# Patient Record
Sex: Female | Born: 1955 | Race: White | Hispanic: No
Health system: Midwestern US, Community
[De-identification: ages and names within clinical notes are randomized; demographics above are authoritative.]

## PROBLEM LIST (undated history)

## (undated) DIAGNOSIS — M2550 Pain in unspecified joint: Secondary | ICD-10-CM

## (undated) DIAGNOSIS — G4733 Obstructive sleep apnea (adult) (pediatric): Secondary | ICD-10-CM

## (undated) DIAGNOSIS — Z1231 Encounter for screening mammogram for malignant neoplasm of breast: Secondary | ICD-10-CM

## (undated) DIAGNOSIS — R509 Fever, unspecified: Secondary | ICD-10-CM

## (undated) DIAGNOSIS — E119 Type 2 diabetes mellitus without complications: Secondary | ICD-10-CM

## (undated) DIAGNOSIS — R1110 Vomiting, unspecified: Secondary | ICD-10-CM

## (undated) DIAGNOSIS — Z1211 Encounter for screening for malignant neoplasm of colon: Secondary | ICD-10-CM

## (undated) DIAGNOSIS — I499 Cardiac arrhythmia, unspecified: Secondary | ICD-10-CM

## (undated) DIAGNOSIS — G894 Chronic pain syndrome: Secondary | ICD-10-CM

## (undated) DIAGNOSIS — R51 Headache: Secondary | ICD-10-CM

## (undated) DIAGNOSIS — T7840XA Allergy, unspecified, initial encounter: Secondary | ICD-10-CM

## (undated) DIAGNOSIS — F17200 Nicotine dependence, unspecified, uncomplicated: Secondary | ICD-10-CM

## (undated) DIAGNOSIS — F329 Major depressive disorder, single episode, unspecified: Secondary | ICD-10-CM

## (undated) DIAGNOSIS — N840 Polyp of corpus uteri: Secondary | ICD-10-CM

## (undated) DIAGNOSIS — R0609 Other forms of dyspnea: Secondary | ICD-10-CM

## (undated) DIAGNOSIS — K219 Gastro-esophageal reflux disease without esophagitis: Secondary | ICD-10-CM

## (undated) DIAGNOSIS — I10 Essential (primary) hypertension: Secondary | ICD-10-CM

## (undated) DIAGNOSIS — D72829 Elevated white blood cell count, unspecified: Secondary | ICD-10-CM

## (undated) DIAGNOSIS — E042 Nontoxic multinodular goiter: Secondary | ICD-10-CM

## (undated) DIAGNOSIS — G4730 Sleep apnea, unspecified: Secondary | ICD-10-CM

## (undated) DIAGNOSIS — M1990 Unspecified osteoarthritis, unspecified site: Secondary | ICD-10-CM

## (undated) DIAGNOSIS — E785 Hyperlipidemia, unspecified: Secondary | ICD-10-CM

## (undated) DIAGNOSIS — R0989 Other specified symptoms and signs involving the circulatory and respiratory systems: Secondary | ICD-10-CM

## (undated) DIAGNOSIS — E0590 Thyrotoxicosis, unspecified without thyrotoxic crisis or storm: Secondary | ICD-10-CM

## (undated) HISTORY — DX: Other specified symptoms and signs involving the circulatory and respiratory systems: R09.89

## (undated) HISTORY — DX: Sleep apnea, unspecified: G47.30

## (undated) HISTORY — DX: Chronic pain syndrome: G89.4

## (undated) HISTORY — DX: Polyp of corpus uteri: N84.0

## (undated) HISTORY — DX: Unspecified osteoarthritis, unspecified site: M19.90

## (undated) HISTORY — DX: Allergy, unspecified, initial encounter: T78.40XA

## (undated) HISTORY — DX: Type 2 diabetes mellitus without complications: E11.9

## (undated) HISTORY — DX: Hyperlipidemia, unspecified: E78.5

## (undated) HISTORY — DX: Nontoxic multinodular goiter: E04.2

## (undated) HISTORY — DX: Other forms of dyspnea: R06.09

## (undated) HISTORY — DX: Major depressive disorder, single episode, unspecified: F32.9

## (undated) HISTORY — DX: Headache: R51

## (undated) HISTORY — DX: Elevated white blood cell count, unspecified: D72.829

## (undated) HISTORY — DX: Nicotine dependence, unspecified, uncomplicated: F17.200

## (undated) HISTORY — DX: Thyrotoxicosis, unspecified without thyrotoxic crisis or storm: E05.90

## (undated) HISTORY — DX: Gastro-esophageal reflux disease without esophagitis: K21.9

## (undated) HISTORY — DX: Essential (primary) hypertension: I10

## (undated) HISTORY — DX: Cardiac arrhythmia, unspecified: I49.9

## (undated) HISTORY — DX: Morbid (severe) obesity due to excess calories: E66.01

---

## 1990-11-21 HISTORY — PX: CHOLECYSTECTOMY: SHX55

## 2003-11-22 HISTORY — PX: OTHER SURGICAL HISTORY: SHX169

## 2008-11-26 MED ORDER — LISINOPRIL 20 MG TAB
20 mg | ORAL_TABLET | Freq: Every day | ORAL | Status: DC
Start: 2008-11-26 — End: 2009-04-22

## 2008-11-26 NOTE — Progress Notes (Signed)
HISTORY OF PRESENT ILLNESS  Jocelyn Allen is a 53 y.o. female.  HPICheryl is seen today after a long absence for follow up of hypertension, diabetes and other concerns.  Overall, Jocelyn Allen is doing fair.      1. Hypertension.  Readings are elevated.  Jocelyn Allen has been off medication for several months now.  Jocelyn Allen never returned for follow up as previously directed.    2. Diabetes.  Jocelyn Allen has not follow up on this as well.  Jocelyn Allen says her blood sugars are averaging about 130 in the mornings.  3. Fibromyalgia.  Jocelyn Allen is on medications from Dr. Mindi Junker.  4. Neuropathy.  Jocelyn Allen has ongoing symptoms.  Diagnosis have never been formally confirmed but Jocelyn Allen does have medications from Dr. Mindi Junker.                  Review of Systems   Respiratory: Negative for shortness of breath.    Cardiovascular: Positive for palpitations. Negative for chest pain.   Musculoskeletal: Positive for myalgias.   Neurological: Positive for tingling and sensory change.       Physical Exam   Nursing note and vitals reviewed.  Constitutional: Jocelyn Allen appears well-nourished.   Neck: Carotid bruit is not present.   Cardiovascular: Normal rate and regular rhythm.  Exam reveals no gallop and no friction rub.    No murmur heard.  Pulmonary/Chest: Effort normal and breath sounds normal. No respiratory distress.   Musculoskeletal: Jocelyn Allen exhibits no edema.       ASSESSMENT and PLAN  Jocelyn Allen was seen today for follow-up.    Diagnoses and associated orders for this visit:    Unspecified myalgia and myositis  - CBC W/ AUTOMATED DIFF; Future    Essential hypertension, benign  - lisinopril (PRINIVIL, ZESTRIL) tablet; Take 1 Tab by mouth daily.    Dm w/o complication type ii  - BASIC METABOLIC PANEL; Future  - HEMOGLOBIN A1C; Future    Encounter for long-term (current) use of other medications  - CBC W/ AUTOMATED DIFF; Future  - HEPATIC FUNCTION PANEL; Future    Other Orders  - DULOXETINE 30 MG CAP, DELAYED RELEASE; Take 30 mg by mouth daily. 1-2 poqhs prn    - LEVORPHANOL TARTRATE 2 MG TAB; Take 2 mg by mouth as directed. 3 qam, 2 12n, 3qpm   - IBUPROFEN 200 MG TAB; Take  by mouth every six (6) hours as needed.  - NAPROXEN SODIUM 220 MG TAB; Take 220 mg by mouth two (2) times daily (with meals).

## 2008-11-27 LAB — HEPATIC FUNCTION PANEL
A-G Ratio: 1 — ABNORMAL LOW (ref 1.1–2.2)
ALT (SGPT): 38 U/L (ref 30–65)
AST (SGOT): 25 U/L (ref 15–37)
Albumin: 3.6 g/dL (ref 3.5–5.0)
Alk. phosphatase: 126 U/L (ref 50–136)
Bilirubin, direct: 0.1 MG/DL (ref 0.0–0.3)
Bilirubin, total: 0.3 MG/DL (ref <1.0)
Globulin: 3.5 g/dL (ref 2.0–4.0)
Protein, total: 7.1 g/dL (ref 6.4–8.4)

## 2008-11-27 LAB — CBC WITH AUTOMATED DIFF
ABS. BASOPHILS: 0 10*3/uL (ref 0.0–0.1)
ABS. EOSINOPHILS: 0.2 10*3/uL (ref 0.0–0.4)
ABS. LYMPHOCYTES: 3.3 10*3/uL (ref 0.8–3.5)
ABS. MONOCYTES: 0.6 10*3/uL (ref 0.0–1.0)
ABS. NEUTROPHILS: 6.9 10*3/uL (ref 1.8–8.0)
BASOPHILS: 0 % (ref 0–1)
EOSINOPHILS: 2 % (ref 0–7)
HCT: 44.5 % (ref 35.0–47.0)
HGB: 14.8 g/dL (ref 11.5–16.0)
LYMPHOCYTES: 30 % (ref 12–49)
MCH: 27.8 PG (ref 26.0–34.0)
MCHC: 33.3 g/dL (ref 30.0–36.5)
MCV: 83.5 FL (ref 80.0–99.0)
MONOCYTES: 6 % (ref 5–13)
NEUTROPHILS: 62 % (ref 32–75)
PLATELET: 308 10*3/uL (ref 150–400)
RBC: 5.33 M/uL — ABNORMAL HIGH (ref 3.80–5.20)
RDW: 13.2 % (ref 11.5–14.5)
WBC: 11 10*3/uL (ref 3.6–11.0)

## 2008-11-27 LAB — METABOLIC PANEL, BASIC
Anion gap: 10 mmol/L (ref 5–15)
BUN/Creatinine ratio: 15 (ref 12–20)
BUN: 12 MG/DL (ref 6–20)
CO2: 30 MMOL/L (ref 21–32)
Calcium: 9 MG/DL (ref 8.5–10.1)
Chloride: 103 MMOL/L (ref 97–108)
Creatinine: 0.8 MG/DL (ref 0.6–1.3)
GFR est AA: >60 mL/min/{1.73_m2} (ref >60)
GFR est non-AA: >60 mL/min/{1.73_m2} (ref >60)
Glucose: 125 MG/DL — ABNORMAL HIGH (ref 50–100)
Potassium: 4 MMOL/L (ref 3.5–5.1)
Sodium: 143 MMOL/L (ref 136–145)

## 2008-11-27 LAB — HEMOGLOBIN A1C WITH EAG: Hemoglobin A1c: 7.7 % — ABNORMAL HIGH (ref 4.2–5.8)

## 2008-11-27 NOTE — Progress Notes (Signed)
Quick Note:    Will call pt. To review    ______

## 2008-12-06 ENCOUNTER — Telehealth

## 2008-12-06 MED ORDER — METFORMIN 500 MG TAB
500 mg | ORAL_TABLET | Freq: Every day | ORAL | Status: DC
Start: 2008-12-06 — End: 2009-11-30

## 2008-12-06 NOTE — Telephone Encounter (Signed)
Reviewed lab  Dm needs rx , start metformin. rto as dir

## 2009-04-23 MED ORDER — LISINOPRIL 20 MG TAB
20 mg | ORAL_TABLET | ORAL | Status: DC
Start: 2009-04-23 — End: 2009-06-24

## 2009-06-15 ENCOUNTER — Encounter: Payer: Self-pay | Admitting: Internal Medicine

## 2009-06-15 LAB — CONVERTED CEMR LAB
Eosinophils Relative: 2 %
HCT: 45.6 %
Hgb A1c MFr Bld: 5.5 %
MCV: 81 fL
Neutrophils Relative %: 59 %
Platelets: 395 10*3/uL
TSH: 0.006 microintl units/mL

## 2009-06-19 NOTE — Telephone Encounter (Signed)
Unable to reach Pt , no voicemail and cell phone is disconnected, received abnormal labs from Dr Mindi Junker today and his office stated Pt. Would be calling for an appt. Pt. Has not called for appt.

## 2009-06-24 ENCOUNTER — Ambulatory Visit

## 2009-06-24 MED ORDER — LISINOPRIL 20 MG TAB
20 mg | ORAL_TABLET | ORAL | Status: DC
Start: 2009-06-24 — End: 2010-01-15

## 2009-06-24 NOTE — Progress Notes (Signed)
Patient in for results of lab work done by Dr. Corliss Blacker

## 2009-06-24 NOTE — Progress Notes (Signed)
HISTORY OF PRESENT ILLNESS  Jocelyn Allen is a 53 y.o. female.  HPICheryl presents today for evaluation of abnormal laboratory studies obtained by her psychiatrist and for overall feeling poorly.    Abnormal labs with malaise.  See Review of Systems.  She has multiple physical symptoms.  She describes having episodic diarrhea with nausea.  She has had fevers for two years getting as high as the low 100s farenheit.  She has low energy.  She has weight loss which is documented on the chart.  She had some waxing and waning of rash that does not follow any particular pattern.  She had some tremor which I observed.    Her abnormal labs from Dr. Mindi Junker include an undetectable TSH, elevated white count at 14.7 and elevated C-reactive protein at  9.3.      1. I discussed at length with Jocelyn Allen the likelihood she has hyperthyroidism.  If her workup is negative, we will consider other rheumatologic causes and also need to rule out occult infection given subjective history of fevers and elevated white count.  2. Chronic pain with fibromyalgia, panic disorder and depression.  She feels these are all stable and had been attributing many of her symptoms to these conditions.  However, she feels now there is some other medical issue ongoing.   3. Hypertension, elevated today.  She also has some tachycardia.   4. Diabetes, stable with A1c at 6.5%.    Social History:  Notable for her not being able to work due to feeling poorly.    Medications:  Fully reviewed.  Please see electronic record.    This was an extended visit of high complexity.    MedDATA/gwo          Review of Systems   Constitutional: Positive for fever, chills, weight loss, malaise/fatigue and diaphoresis.   HENT: Positive for neck pain.    Respiratory: Positive for cough.    Cardiovascular: Positive for palpitations. Negative for leg swelling.   Gastrointestinal: Positive for diarrhea.   Musculoskeletal: Positive for myalgias, back pain and joint pain.    Skin: Positive for rash.        scattered   Neurological: Positive for weakness.   Psychiatric/Behavioral: The patient is nervous/anxious.    All other systems reviewed and are negative.        Physical Exam   Nursing note and vitals reviewed.  Constitutional: No distress.   HENT:   Mouth/Throat: No oropharyngeal exudate.   Neck: Neck supple. No thyromegaly present.   Cardiovascular: Regular rhythm.  Tachycardia present.  Exam reveals no gallop and no friction rub.    No murmur heard.  Pulmonary/Chest: Effort normal and breath sounds normal.   Musculoskeletal: She exhibits no edema.   Lymphadenopathy:     She has no cervical adenopathy.   Skin: No rash noted. She is diaphoretic.       ASSESSMENT and PLAN  Jocelyn Allen was seen today for follow-up.    Diagnoses and associated orders for this visit:    Abnormal thyroid function test- likely thyrotoxicosis, r/o autoimmune disease if endo work up is negative, also consider atypical infection  - T3, FREE; Future  - T4, FREE; Future  - REFERRAL TO ENDOCRINOLOGY    Tachycardia  - T3, FREE; Future  - T4, FREE; Future  - BASIC METABOLIC PANEL; Future  - AMB POC EKG ROUTINE W/ 12 LEADS, INTER & REP    Fever  - CULTURE, BLOOD; Future  - XR CHEST PA  AND LATERAL; Future  - CBC W/ AUTOMATED DIFF; Future    Essential hypertension, benign- follow pending above    Other Orders  - TAPENTADOL 100 MG TAB; Take  by mouth.  - DESVENLAFAXINE ER 100 MG 24 HR TAB; Take  by mouth.

## 2009-06-25 NOTE — Progress Notes (Signed)
Quick Note:    See hard copy of labs  ______

## 2009-06-29 LAB — CULTURE, BLOOD
Culture result:: NO GROWTH
Report Status: 8092010

## 2009-06-30 NOTE — Telephone Encounter (Signed)
Pt. Notified appt. With Dr Adriana Simas 07/03/09 3p and should hear from Dr Venetia Maxon for appt. Records faxed to both.

## 2009-07-02 ENCOUNTER — Encounter

## 2009-07-03 LAB — HEPATITIS B SURFACE ANTIBODY
Hep B S Ab: NONREACTIVE
Hepatitis B Surface Ag: 3.26 m[IU]/mL

## 2009-07-03 LAB — CBC WITH AUTOMATED DIFF
ABS. BASOPHILS: 0 10*3/uL (ref 0.0–0.1)
ABS. EOSINOPHILS: 0.2 10*3/uL (ref 0.0–0.4)
ABS. LYMPHOCYTES: 3.5 10*3/uL (ref 0.8–3.5)
ABS. MONOCYTES: 0.5 10*3/uL (ref 0.0–1.0)
ABS. NEUTROPHILS: 7.6 10*3/uL (ref 1.8–8.0)
BASOPHILS: 0 % (ref 0–1)
EOSINOPHILS: 2 % (ref 0–7)
HCT: 43.7 % (ref 35.0–47.0)
HGB: 14.6 g/dL (ref 11.5–16.0)
LYMPHOCYTES: 30 % (ref 12–49)
MCH: 27.4 PG (ref 26.0–34.0)
MCHC: 33.4 g/dL (ref 30.0–36.5)
MCV: 82 FL (ref 80.0–99.0)
MONOCYTES: 5 % (ref 5–13)
NEUTROPHILS: 63 % (ref 32–75)
PLATELET: 336 10*3/uL (ref 150–400)
RBC: 5.33 M/uL — ABNORMAL HIGH (ref 3.80–5.20)
RDW: 13.1 % (ref 11.5–14.5)
WBC: 11.9 10*3/uL — ABNORMAL HIGH (ref 3.6–11.0)

## 2009-07-03 LAB — HEP B SURFACE AB
Hep B surface Ab Interp.: NONREACTIVE
Hepatitis B surface Ab: 3.26 m[IU]/mL

## 2009-07-03 LAB — METABOLIC PANEL, BASIC
Anion gap: 11 mmol/L (ref 5–15)
BUN/Creatinine ratio: 18 (ref 12–20)
BUN: 11 MG/DL (ref 6–20)
CO2: 26 MMOL/L (ref 21–32)
Calcium: 9.5 MG/DL (ref 8.5–10.1)
Chloride: 98 MMOL/L (ref 97–108)
Creatinine: 0.6 MG/DL (ref 0.6–1.3)
GFR est AA: >60 mL/min/{1.73_m2} (ref >60)
GFR est non-AA: >60 mL/min/{1.73_m2} (ref >60)
Glucose: 154 MG/DL — ABNORMAL HIGH (ref 65–100)
Potassium: 3.9 MMOL/L (ref 3.5–5.1)
Sodium: 135 MMOL/L — ABNORMAL LOW (ref 136–145)

## 2009-07-03 LAB — C REACTIVE PROTEIN, QT: C-Reactive protein: 1.03 mg/dL — ABNORMAL HIGH (ref 0.00–0.60)

## 2009-07-03 LAB — T4, FREE: T4, Free: 0.9 NG/DL (ref 0.8–1.5)

## 2009-07-03 LAB — T3, FREE: Free Triiodothyronine (T3): 3.6 pg/mL (ref 2.2–4.0)

## 2009-07-03 LAB — SED RATE (ESR): Sed rate (ESR): 14 MM/HR (ref 0–30)

## 2009-07-03 LAB — FERRITIN: Ferritin: 200 NG/ML (ref 8–252)

## 2009-07-04 LAB — HEPATITIS B CORE ANTIBODY, TOTAL: Hepatitis B Core Antibody, Total: NEGATIVE

## 2009-07-04 LAB — HEP B SURFACE AG: Hep B surface Ag: NEGATIVE

## 2009-07-04 LAB — HEPATITIS B CORE AB, TOTAL: Hep B Core Ab,Total: NEGATIVE

## 2009-07-05 LAB — HEPATITIS C ANTIBODY
HEPATITIS C AB INDEX, HCAB1: 0.09 IV
HEPATITIS C AB,HCAB: NEGATIVE

## 2009-07-05 LAB — HEPATITIS C AB
Hepatitis C Ab Index: 0.09 IV
Hepatitis C Ab: NEGATIVE

## 2009-07-05 NOTE — Progress Notes (Signed)
Quick Note:    She is referred for both endo and rheum consults  ______

## 2009-07-29 NOTE — Telephone Encounter (Signed)
Patient needs an appointment scheduled with Dr. Adriana Simas, Endocrinologist in the afternoon first available.

## 2009-07-29 NOTE — Telephone Encounter (Signed)
Message copied by Wilford Sports on Wed Jul 29, 2009  4:21 PM  ------       Message from: Christy Gentles A       Created: Wed Jul 29, 2009  8:12 AM       Regarding: acs    pt requesting callback from nurse         [facility] 07/28/2009 5:10pm dwillis DOB 14-Dec-1955 Dr. Dondra Spry [phone] Patient would like Melanie to give her a call back.       ks

## 2009-07-31 NOTE — Telephone Encounter (Signed)
Patient's records have been sent to Dr. Patsey Berthold office and they will call patient to schedule.

## 2009-11-30 MED ORDER — METFORMIN 500 MG TAB
500 mg | ORAL_TABLET | ORAL | Status: AC
Start: 2009-11-30 — End: ?

## 2010-01-11 ENCOUNTER — Ambulatory Visit: Payer: Self-pay | Admitting: Internal Medicine

## 2010-01-11 DIAGNOSIS — E119 Type 2 diabetes mellitus without complications: Secondary | ICD-10-CM

## 2010-01-11 DIAGNOSIS — K219 Gastro-esophageal reflux disease without esophagitis: Secondary | ICD-10-CM

## 2010-01-11 DIAGNOSIS — F17200 Nicotine dependence, unspecified, uncomplicated: Secondary | ICD-10-CM

## 2010-01-11 DIAGNOSIS — F329 Major depressive disorder, single episode, unspecified: Secondary | ICD-10-CM

## 2010-01-11 DIAGNOSIS — F3289 Other specified depressive episodes: Secondary | ICD-10-CM

## 2010-01-11 DIAGNOSIS — R490 Dysphonia: Secondary | ICD-10-CM

## 2010-01-11 DIAGNOSIS — D72829 Elevated white blood cell count, unspecified: Secondary | ICD-10-CM

## 2010-01-11 DIAGNOSIS — I10 Essential (primary) hypertension: Secondary | ICD-10-CM

## 2010-01-11 DIAGNOSIS — N840 Polyp of corpus uteri: Secondary | ICD-10-CM

## 2010-01-11 DIAGNOSIS — G4730 Sleep apnea, unspecified: Secondary | ICD-10-CM

## 2010-01-11 DIAGNOSIS — I499 Cardiac arrhythmia, unspecified: Secondary | ICD-10-CM | POA: Insufficient documentation

## 2010-01-11 HISTORY — DX: Morbid (severe) obesity due to excess calories: E66.01

## 2010-01-11 HISTORY — DX: Cardiac arrhythmia, unspecified: I49.9

## 2010-01-11 HISTORY — DX: Gastro-esophageal reflux disease without esophagitis: K21.9

## 2010-01-11 HISTORY — DX: Nicotine dependence, unspecified, uncomplicated: F17.200

## 2010-01-11 HISTORY — DX: Other specified depressive episodes: F32.89

## 2010-01-11 HISTORY — DX: Polyp of corpus uteri: N84.0

## 2010-01-11 HISTORY — DX: Sleep apnea, unspecified: G47.30

## 2010-01-11 HISTORY — DX: Elevated white blood cell count, unspecified: D72.829

## 2010-01-11 HISTORY — DX: Essential (primary) hypertension: I10

## 2010-01-11 HISTORY — DX: Type 2 diabetes mellitus without complications: E11.9

## 2010-01-11 HISTORY — DX: Major depressive disorder, single episode, unspecified: F32.9

## 2010-01-11 LAB — CONVERTED CEMR LAB
BUN: 11 mg/dL (ref 6–23)
Basophils Relative: 0.5 % (ref 0.0–3.0)
CO2: 28 meq/L (ref 19–32)
Creatinine, Ser: 0.5 mg/dL (ref 0.4–1.2)
Creatinine,U: 79.5 mg/dL
Direct LDL: 128.3 mg/dL
GFR calc non Af Amer: 136.49 mL/min (ref >60)
HCT: 43.8 % (ref 36.0–46.0)
Lymphocytes Relative: 29.3 % (ref 12.0–46.0)
Lymphs Abs: 3.3 10*3/uL (ref 0.7–4.0)
MCHC: 33.7 g/dL (ref 30.0–36.0)
MCV: 85.6 fL (ref 78.0–100.0)
Microalb, Ur: 3.4 mg/dL — ABNORMAL HIGH (ref 0.0–1.9)
Monocytes Absolute: 0.6 10*3/uL (ref 0.1–1.0)
Neutro Abs: 7.2 10*3/uL (ref 1.4–7.7)
Platelets: 272 10*3/uL (ref 150.0–400.0)
Potassium: 4.4 meq/L (ref 3.5–5.1)
RDW: 12.4 % (ref 11.5–14.6)
Sodium: 140 meq/L (ref 135–145)
TSH: 0.07 microintl units/mL — ABNORMAL LOW (ref 0.35–5.50)
Triglycerides: 277 mg/dL — ABNORMAL HIGH (ref 0.0–149.0)

## 2010-01-14 ENCOUNTER — Encounter: Admission: RE | Admit: 2010-01-14 | Discharge: 2010-01-14 | Payer: Self-pay | Admitting: Internal Medicine

## 2010-01-15 ENCOUNTER — Encounter: Payer: Self-pay | Admitting: Internal Medicine

## 2010-01-18 ENCOUNTER — Telehealth: Payer: Self-pay | Admitting: Internal Medicine

## 2010-01-18 MED ORDER — LISINOPRIL 20 MG TAB
20 mg | ORAL_TABLET | ORAL | Status: AC
Start: 2010-01-18 — End: ?

## 2010-01-22 ENCOUNTER — Ambulatory Visit: Payer: Self-pay | Admitting: Endocrinology

## 2010-01-22 DIAGNOSIS — E0590 Thyrotoxicosis, unspecified without thyrotoxic crisis or storm: Secondary | ICD-10-CM

## 2010-01-22 DIAGNOSIS — E042 Nontoxic multinodular goiter: Secondary | ICD-10-CM

## 2010-01-22 HISTORY — DX: Nontoxic multinodular goiter: E04.2

## 2010-01-22 HISTORY — DX: Thyrotoxicosis, unspecified without thyrotoxic crisis or storm: E05.90

## 2010-02-04 ENCOUNTER — Encounter: Admission: RE | Admit: 2010-02-04 | Discharge: 2010-02-04 | Payer: Self-pay | Admitting: Obstetrics and Gynecology

## 2010-02-10 ENCOUNTER — Encounter (HOSPITAL_COMMUNITY): Admission: RE | Admit: 2010-02-10 | Discharge: 2010-04-21 | Payer: Self-pay | Admitting: Endocrinology

## 2010-02-24 ENCOUNTER — Ambulatory Visit (HOSPITAL_COMMUNITY): Admission: RE | Admit: 2010-02-24 | Discharge: 2010-02-24 | Payer: Self-pay | Admitting: Endocrinology

## 2010-03-01 ENCOUNTER — Telehealth: Payer: Self-pay | Admitting: Internal Medicine

## 2010-08-25 ENCOUNTER — Telehealth: Payer: Self-pay | Admitting: Internal Medicine

## 2010-08-31 ENCOUNTER — Ambulatory Visit: Payer: Self-pay | Admitting: Internal Medicine

## 2010-09-01 LAB — CONVERTED CEMR LAB: Hgb A1c MFr Bld: 6.2 % (ref 4.6–6.5)

## 2010-09-02 ENCOUNTER — Ambulatory Visit: Payer: Self-pay | Admitting: Endocrinology

## 2010-09-02 LAB — CONVERTED CEMR LAB
BUN: 13 mg/dL (ref 6–23)
Basophils Absolute: 0 10*3/uL (ref 0.0–0.1)
CO2: 29 meq/L (ref 19–32)
Calcium: 9.5 mg/dL (ref 8.4–10.5)
Creatinine, Ser: 0.7 mg/dL (ref 0.4–1.2)
Eosinophils Absolute: 0.4 10*3/uL (ref 0.0–0.7)
GFR calc non Af Amer: 87.98 mL/min (ref >60)
Glucose, Bld: 110 mg/dL — ABNORMAL HIGH (ref 70–99)
Lymphocytes Relative: 34.1 % (ref 12.0–46.0)
MCV: 86.8 fL (ref 78.0–100.0)
Monocytes Absolute: 0.6 10*3/uL (ref 0.1–1.0)
RBC: 5.34 M/uL — ABNORMAL HIGH (ref 3.87–5.11)
RDW: 13.5 % (ref 11.5–14.6)

## 2010-09-13 ENCOUNTER — Encounter: Admission: RE | Admit: 2010-09-13 | Payer: Self-pay | Admitting: Endocrinology

## 2010-09-24 ENCOUNTER — Ambulatory Visit: Payer: Self-pay | Admitting: Internal Medicine

## 2010-10-20 ENCOUNTER — Ambulatory Visit: Payer: Self-pay | Admitting: Internal Medicine

## 2010-11-24 ENCOUNTER — Encounter: Payer: Self-pay | Admitting: Internal Medicine

## 2010-11-30 ENCOUNTER — Ambulatory Visit
Admission: RE | Admit: 2010-11-30 | Discharge: 2010-11-30 | Payer: Self-pay | Source: Home / Self Care | Attending: Internal Medicine | Admitting: Internal Medicine

## 2010-11-30 ENCOUNTER — Other Ambulatory Visit: Payer: Self-pay | Admitting: Internal Medicine

## 2010-11-30 DIAGNOSIS — R51 Headache: Secondary | ICD-10-CM

## 2010-11-30 DIAGNOSIS — R519 Headache, unspecified: Secondary | ICD-10-CM | POA: Insufficient documentation

## 2010-11-30 HISTORY — DX: Headache: R51

## 2010-11-30 LAB — HEMOGLOBIN A1C: Hgb A1c MFr Bld: 6.2 % (ref 4.6–6.5)

## 2010-11-30 LAB — TSH: TSH: 1.35 u[IU]/mL (ref 0.35–5.50)

## 2010-12-07 ENCOUNTER — Ambulatory Visit: Admit: 2010-12-07 | Payer: Self-pay | Admitting: Internal Medicine

## 2010-12-11 ENCOUNTER — Encounter: Payer: Self-pay | Admitting: Endocrinology

## 2010-12-12 ENCOUNTER — Encounter: Payer: Self-pay | Admitting: Endocrinology

## 2010-12-14 ENCOUNTER — Ambulatory Visit: Admit: 2010-12-14 | Payer: Self-pay | Admitting: Internal Medicine

## 2010-12-21 NOTE — Consult Note (Signed)
Summary: [facility]  [facility]   Imported By: Sherian Rein 01/20/2010 07:43:28  _____________________________________________________________________  External Attachment:    Type:   Image     Comment:   External Document

## 2010-12-21 NOTE — Progress Notes (Signed)
Summary: Pain Rx?  Phone Note Call from Patient Call back at Home Phone [phone]   Caller: Patient Summary of Call: pt called stating that she was unable to make appt with pain mgmt in [place] this week and Dr. Corliss Blacker (pain mgmt MD) would like VAL to call his office to get his okay to write RX for pt. Dr. Mindi Junker [phone]. Initial call taken by: Margaret Pyle, CMA,  March 01, 2010 11:48 AM  Follow-up for Phone Call        no - i do not write for her pain med rxs - if her pain med doc ([facility]) needs for me to do a limited rx on his behalf pending the rescheduled appointment, their office needs to contact me with these instructions - thanks Follow-up by: Newt Lukes MD,  March 01, 2010 12:31 PM  Additional Follow-up for Phone Call Additional follow up Details #1::        pt informed Additional Follow-up by: Margaret Pyle, CMA,  March 01, 2010 1:15 PM

## 2010-12-21 NOTE — Progress Notes (Signed)
Summary: Rx refill req  Phone Note Refill Request Message from:  Patient on August 25, 2010 4:05 PM  Refills Requested: Medication #1:  METOPROLOL SUCCINATE 25 MG XR24H-TAB 1 once daily.   Dosage confirmed as above?Dosage Confirmed   Supply Requested: 1 month  Medication #2:  LISINOPRIL 20 MG TABS take 1 by mouth qd   Dosage confirmed as above?Dosage Confirmed   Supply Requested: 1 month Pt called stating she has appt 10/11. Pt is requesting #30 to [facility].   Method Requested: Electronic Initial call taken by: Margaret Pyle, CMA,  August 25, 2010 4:06 PM    Prescriptions: METOPROLOL SUCCINATE 25 MG XR24H-TAB (METOPROLOL SUCCINATE) 1 once daily  #30 Tablet x 0   Entered by:   Margaret Pyle, CMA   Authorized by:   Newt Lukes MD   Signed by:   Margaret Pyle, CMA on 08/25/2010   Method used:   Electronically to        [facility] [phone]* (retail)       [address] [place], [place]  [address]       Ph: [phone]       Fax: [phone]   RxID:   [id number] LISINOPRIL 20 MG TABS (LISINOPRIL) take 1 by mouth qd  #30 Tablet x 0   Entered by:   Margaret Pyle, CMA   Authorized by:   Newt Lukes MD   Signed by:   Margaret Pyle, CMA on 08/25/2010   Method used:   Electronically to        [facility] [address] (retail)       [address] [place], [place]  [address]       Ph: [phone]       Fax: [phone]   RxID:   [id number]

## 2010-12-21 NOTE — Assessment & Plan Note (Signed)
Summary: Pulmonary/ new pt ov for VCD  ? all GERD ? functional   Visit Type:  Initial Consult Copy to:  Newt Lukes MD Primary Provider/Referring Provider:  Newt Lukes MD  CC:  Dyspnea.  History of Present Illness: 55 [name]  morbid obesity disabled RN  active smoker with no breathing problems except nasal allergies until around 2010 while in [place] on ACE inhhibitors chronically.  September 24, 2010  1st pulmonary office eval cc cough/hoarseness x 2 years>  seen by Pollyann Kennedy "who felt problem was thyroid"  rx with I 131 .    No better off lisnopril x 4weeks on initial pulmonary eval. cough is dry, day > night with doe x > 50 ft without much variation.  Pt denies any significant sore throat, dysphagia, itching, sneezing,  nasal congestion or excess secretions,  fever, chills, sweats, unintended wt loss, pleuritic or exertional cp, hempoptysis, change in activity tolerance  orthopnea pnd or leg swelling. Pt also denies any obvious fluctuation in symptoms with weather or environmental change or other alleviating or aggravating factors.       Current Medications (verified): 1)  Metformin Hcl 500 Mg Tabs (Metformin Hcl) .... Take 1 By Mouth Once Daily 2)  Nucynta 100 Mg Tabs (Tapentadol Hcl) .... Take 1 By Mouth Three Times A Day (Per Dr. Mindi Junker) 3)  Pristiq 100 Mg Xr24h-Tab (Desvenlafaxine Succinate) .... Take 1 By Mouth Once Daily 4)  Metoprolol Succinate 50 Mg Xr24h-Tab (Metoprolol Succinate) .Marland Kitchen.. 1 By Mouth Once Daily 5)  Omeprazole 20 Mg Cpdr (Omeprazole) .Marland Kitchen.. 1 By Mouth Once Daily 6)  Advil 200 Mg Tabs (Ibuprofen) .... Use As Needed  Allergies (verified): 1)  ! * Droperidol  Past History:  Past Medical History: Diabetes mellitus, type II GERD Hypertension      - Off ACE  08/31/10 OSA depression chronic pain - intercostal bilateral and back but soft tissue related, not arthritis hyperthyroidism s/p I131ablation 02/24/2010  MD roster: pain mgmt - [facility] in  [place], [place] (will cont to travel there for this care) endo - [facility] - [facility] Pulmonary - [facility]  Family History: Reviewed history from 01/22/2010 and no changes required. Family History of Alcoholism/Addiction (parent) Family History of Arthritis (grandparent) Family History of Colon CA 1st degree relative <60 (parent) Family History Hypertension (parent) Heart disease (parent) no goiter or other thyroid problem in immediate family, but it is in several members of her extended family Emphysema- Mother  Social History: Current smoker since age 55.  Smoking on and off since then- 1/2 ppd. 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  The patient denies shortness of breath at rest, productive cough, coughing up blood, chest pain, acid heartburn, indigestion, loss of appetite, weight change, abdominal pain, difficulty swallowing, sore throat, tooth/dental problems, headaches, nasal congestion/difficulty breathing through nose, sneezing, itching, ear ache, anxiety, depression, rash, change in color of mucus, and fever.    Vital Signs:  Patient profile:   55 year old female Weight:      276.25 pounds BMI:     49.11 O2 Sat:      97 % on Room air Temp:     98.1 degrees  F oral Pulse rate:   90 / minute BP sitting:   126 / 88  (left arm) Cuff size:   large  Vitals Entered By: Vernie Murders (September 24, 2010 11:32 AM)  O2 Flow:  Room air  Physical Exam  Additional Exam:  obese depressed amb wf with severe hoarseness to point of resting stridor wt 274 > 276 September 24, 2010 HEENT mild turbinate edema.  Oropharynx no thrush or excess pnd or cobblestoning.  No JVD or cervical adenopathy. Mild  accessory muscle hypertrophy. Trachea midline, nl thryroid. Chest was hyperinflated by percussion with diminished breath sounds and moderate increased exp time without wheeze. Hoover sign positive at mid inspiration. Regular rate and rhythm without murmur gallop or rub or increase P2 or edema.  Abd: no hsm, nl excursion. Ext warm without cyanosis or clubbing.     CXR  Procedure date:  09/24/2010  Findings:      Findings: No active infiltrate or effusion is seen.  The lungs are clear.  Slightly prominent interstitial markings most likely are chronic.  Mediastinal contours appear normal.  The heart is mildly enlarged.  There are degenerative changes throughout the thoracic spine.   IMPRESSION: Cardiomegaly.  Probable mild chronic interstitial change.  No definite active process.  Impression & Recommendations:  Problem # 1:  DYSPNEA (ICD-786.09)   DDX of  difficult airways managment all start with A and  include Adherence, Ace Inhibitors, Acid Reflux, Active Sinus Disease, Alpha 1 Antitripsin deficiency, Anxiety masquerading as Airways dz,  ABPA,  allergy(esp in young), Aspiration (esp in elderly), Adverse effects of DPI,  Active smokers,Acquaintance of or Actual Health care Provider,  plus one B  = Beta blocker use..  and one C = CHF  Adherence seems good  ACE inhibitors :  off < 4 weeks, needs 6 weeks for complete washout  Active smoking: see #2  Acid reflux:  See instructions for specific recommendations   Actual Health Care Provider:  some of our most difficult pts are RNs with vcd, not sure why - this may be largely anxiety-based but this is a dx of exclusion  Betablockers: worth try option : Bystolic, the most beta -1  selective Beta blocker available in sample form, with bisoprolol the most selective generic choice  on the market.   Orders: Prescription Created Electronically [phone]) Consultation Level V [phone])  Problem # 2:  TOBACCO ABUSE (ICD-305.1)   I emphasized  that although we never turn away smokers from the pulmonary clinic, we do ask that they understand that the recommendations that were made won't work nearly as well in the presence of continued cigarette exposure and we may reach a point where we can't help the patient if he/she can't quit smoking.    My sense is that she only has very mild lung dz at this point .  I reviewed the Flethcher curve with her that basically says if you quit smoking when your best day FEV1 is still well preserved it is highly unlikely you will progress to severe disease and informed the patient there was no medication on the market that has proven to change the curve or the likelihood of progression  Needs pft's for baseline  Orders: Consultation Level V (25956)  Medications Added to Medication List This Visit: 1)  Prevacid Solutab 30 Mg Tbdp (Lansoprazole) .... By mouth daily. take one half hour before eating. 2)  Pepcid 20 Mg Tabs (Famotidine) .... One at bedtime 3)  Bystolic 5 Mg  Tabs (Nebivolol hcl) .... One tablet daily 4)  Tramadol Hcl 50 Mg Tabs (Tramadol hcl) .... One to two by mouth every 4-6 hours as needed  Other Orders: T-2 View CXR (71020TC)  Patient Instructions: 1)  Prevacid 15 mg  2  30-60 min before first and last meals of the day  2)  Pepcid 20mg  one at bedtime 3)  Stop metaprolol and replace with bystolic 5 mg one daily 4)  Tramadol 50 mg every 4 hours if needed for cough 5)  GERD (REFLUX)  is a common cause of respiratory symptoms. It commonly presents without heartburn and can be treated with medication, but also with lifestyle changes including avoidance of late meals, excessive alcohol, smoking cessation, and avoid fatty foods, chocolate, peppermint, colas, red wine, and acidic juices such as orange juice. NO MINT OR MENTHOL PRODUCTS SO NO COUGH DROPS  6)  USE SUGARLESS CANDY INSTEAD (jolley ranchers)  7)  NO OIL BASED VITAMINS  8)  Please schedule a follow-up appointment in 4 weeks, sooner  if needed  Prescriptions: TRAMADOL HCL 50 MG  TABS (TRAMADOL HCL) One to two by mouth every 4-6 hours as needed  #40 x 0   Entered and Authorized by:   Nyoka Cowden MD   Signed by:   Nyoka Cowden MD on 09/24/2010   Method used:   Electronically to        [facility] [address] (retail)       [address] [place], [place]  [address]       Ph: [phone]       Fax: [phone]   RxID:   [id number]

## 2010-12-21 NOTE — Assessment & Plan Note (Signed)
Summary: thyroid problem/cd   Vital Signs:  Patient profile:   55 year old female Height:      63 inches Weight:      274 pounds BMI:     48.71 O2 Sat:      97 % on Room air Temp:     99.1 degrees F oral Pulse rate:   104 / minute BP sitting:   140 / 90  (left arm) Cuff size:   large  Vitals Entered By: Alysia Penna (September 02, 2010 4:05 PM)  O2 Flow:  Room air CC: pt c/o swelling on right side of neck. Pain on right shoulder. has been having a low grade fever. Nausea. Diarrhea 3-4 times a day. has not had a regular bowel movement in about 3 wks. /cp sma   Primary Provider:  Newt Lukes MD  CC:  pt c/o swelling on right side of neck. Pain on right shoulder. has been having a low grade fever. Nausea. Diarrhea 3-4 times a day. has not had a regular bowel movement in about 3 wks. /cp sma.  History of Present Illness: see above.  she is overall no different since she was seen here 2 days ago.   she has few years of right shoulder pain, and slight assoc numbness of the right hand.  it is slightly worse recently.  Current Medications (verified): 1)  Advil 200 Mg Tabs (Ibuprofen) .... Use As Needed 2)  Metformin Hcl 500 Mg Tabs (Metformin Hcl) .... Take 1 By Mouth Once Daily 3)  Nucynta 100 Mg Tabs (Tapentadol Hcl) .... Take 1 By Mouth Three Times A Day (Per Dr. Mindi Junker) 4)  Pristiq 100 Mg Xr24h-Tab (Desvenlafaxine Succinate) .... Take 1 By Mouth Once Daily 5)  Metoprolol Succinate 50 Mg Xr24h-Tab (Metoprolol Succinate) .Marland Kitchen.. 1 By Mouth Once Daily 6)  Omeprazole 20 Mg Cpdr (Omeprazole) .Marland Kitchen.. 1 By Mouth Once Daily  Allergies (verified): 1)  ! * Droperidol  Past History:  Past Medical History: Last updated: 08/31/2010 Diabetes mellitus, type II GERD Hypertension OSA depression chronic pain - intercostal bilateral and back but soft tissue related, not arthritis hyperthyroidism s/p ablation 02/2010  MD roster: pain mgmt - [facility] in [place], [place] (will cont to travel  there for this care) endo - [facility] - [facility]  Review of Systems       low-grade fever persists.  no change in chronic doe.  Physical Exam  General:  morbidly obese.  no distress  Neck:  i do not appreciate a goiter Abdomen:  abdomen is soft, nontender.  no hepatosplenomegaly.   not distended.  no hernia obesity limits exam Msk:  right shoulder:  abduction is severely limited by pain it is nontender Additional Exam:  FastTSH                   1.70 uIU/mL      Impression & Recommendations:  Problem # 1:  GOITER, MULTINODULAR (ICD-241.1) Assessment Improved  Problem # 2:  HYPERTHYROIDISM (ICD-242.90) resolved with i-131 rx  Problem # 3:  shoulder pain Assessment: Deteriorated slightly worse  Problem # 4:  viral illness slightly better  Other Orders: TLB-BMP (Basic Metabolic Panel-BMET) (80048-METABOL) TLB-CBC Platelet - w/Differential (85025-CBCD) Radiology Referral (Radiology) Est. Patient Level IV ([address])  Patient Instructions: 1)  recheck thyroid ultrasound.  you will be called with a day and time for an appointment 2)  also, blood tests are being ordered for you today.  for these and the ultrasound, please call  [phone] to hear your test results. 3)  let me know if you want medication for the nausea. 4)  (update: i left message on phone-tree:  rx as we discussed)

## 2010-12-21 NOTE — Miscellaneous (Signed)
Summary: Orders Update pft charges  Clinical Lists Changes  Orders: Added new Service order of Carbon Monoxide diffusing w/capacity (94720) - Signed Added new Service order of Lung Volumes (94240) - Signed Added new Service order of Spirometry (Pre & Post) (94060) - Signed 

## 2010-12-21 NOTE — Assessment & Plan Note (Signed)
Summary: Pulmonary/ ext f/u ov with vcd ? gerd related   Copy to:  Victoria Lukes MD Primary Provider/Referring Provider:  Newt Lukes MD  CC:  Dyspnea- the same.  History of Present Illness: 55 yowf  morbid obesity disabled RN  active smoker with no breathing problems except nasal allergies until around 2010 while in  on ACE inhhibitors chronically.  September 24, 2010  1st pulmonary office eval cc cough/hoarseness x 2 years>  seen by Pollyann Kennedy "who felt problem was thyroid"  rx with I 131 .    No better off lisnopril x 4weeks on initial pulmonary eval. cough is dry, day > night with doe x > 50 ft without much variation. Prevacid 15 mg  2  30-60 min before first and last meals of the day  Pepcid 20mg  one at bedtime Stop metaprolol and replace with bystolic 5 mg one daily Tramadol 50 mg every 4 hours if needed for cough GERD (REFLUX)  diet   October 20, 2010 ov c/o doe x hc parking mail box and back, also still hoarse, not surpressing cough with tramadol, not taking ppi as directed.  Pt denies any significant sore throat, dysphagia, itching, sneezing,  nasal congestion or excess secretions,  fever, chills, sweats, unintended wt loss, pleuritic or exertional cp, hempoptysis,  variability  in activity tolerance  orthopnea pnd or leg swelling. Pt also denies any obvious fluctuation in symptoms with weather or environmental change or other alleviating or aggravating factors.        Preventive Screening-Counseling & Management  Alcohol-Tobacco     Smoking Status: current     Smoking Cessation Counseling: yes  Current Medications (verified): 1)  Metformin Hcl 500 Mg Tabs (Metformin Hcl) .... Take 1 By Mouth Once Daily 2)  Nucynta 100 Mg Tabs (Tapentadol Hcl) .... Take 1 By Mouth Three Times A Day (Per Dr. Mindi Junker) 3)  Pristiq 100 Mg Xr24h-Tab (Desvenlafaxine Succinate) .... Take 1 By Mouth Once Daily 4)  Advil 200 Mg Tabs (Ibuprofen) .... Use As Needed 5)  Prevacid  Solutab 30 Mg  Tbdp (Lansoprazole) .... By Mouth Daily. Take One Half Hour Before Eating.- Ran Out 6)  Pepcid 20 Mg Tabs (Famotidine) .... One At Bedtime 7)  Bystolic 5 Mg  Tabs (Nebivolol Hcl) .... One Tablet Daily 8)  Tramadol Hcl 50 Mg  Tabs (Tramadol Hcl) .... One To Two By Mouth Every 4-6 Hours As Needed  Allergies (verified): 1)  ! * Droperidol  Past History:  Past Medical History: Diabetes mellitus, type II GERD Hypertension      - Off ACE  08/31/10 OSA depression chronic pain - intercostal bilateral and back but soft tissue related, not arthritis hyperthyroidism s/p I131ablation 02/24/2010 DOE     - PFT's wnl x poor insp loop October 20, 2010   MD roster: pain mgmt - [facility] in [place], [place] (will cont to travel there for this care) endo - [facility] ENT - [facility] Pulmonary - [facility]  Vital Signs:  Patient profile:   55 year old female Weight:      273 pounds O2 Sat:      96 % on Room air Temp:     98.8 degrees F oral Pulse rate:   102 / minute BP sitting:   140 / 88  (right arm) Cuff size:   large  Vitals Entered ByVernie Murders (October 20, 2010 11:56 AM)  O2 Flow:  Room air  Physical Exam  Additional Exam:  obese depressed  amb wf with severe hoarseness to point of resting stridor wt 274 > 276 September 24, 2010 > 273 October 20, 2010  HEENT mild turbinate edema.  Oropharynx no thrush or excess pnd or cobblestoning.  No JVD or cervical adenopathy. Mild accessory muscle hypertrophy. Trachea midline, nl thryroid. Chest was hyperinflated by percussion with diminished breath sounds and moderate increased exp time without wheeze. Hoover sign positive at mid inspiration. Regular rate and rhythm without murmur gallop or rub or increase P2 or edema.  Abd: no hsm, nl excursion. Ext warm without cyanosis or clubbing.     Impression & Recommendations:  Problem # 1:  DYSPNEA (ICD-786.09)   DDX of  difficult airways managment all start with A and  include Adherence, Ace  Inhibitors, Acid Reflux, Active Sinus Disease, Alpha 1 Antitripsin deficiency, Anxiety masquerading as Airways dz,  ABPA,  allergy(esp in young), Aspiration (esp in elderly), Adverse effects of DPI,  Active smokers,Acquaintance of or Actual Health care Provider,  plus one B  = Beta blocker use..  and one C = CHF  Adherence seems good  ACE inhibitors :  would never use this class again in this setting  Active smoking: see #2  Acid reflux:  See instructions for specific recommendations - did not follow first set despite RN status  try again this time with reglan   PleDiscussed in detail all the  indications, usual  risks and alternatives  relative to the benefits with patient who agrees to proceed with short term reglan use.   Actual Health Care Provider:  some of our most difficult pts are RNs with vcd, not sure why - this may be largely anxiety-based but this is a dx of exclusion  Betablockers: worth trial  using  Bystolic, the most beta -1  selective Beta blocker available in sample form, with bisoprolol the most selective generic choice  on the market.      Problem # 2:  TOBACCO ABUSE (ICD-305.1)   I emphasized that although we never turn away smokers from the pulmonary clinic, we do ask that they understand that the recommendations that were made won't work nearly as well in the presence of continued cigarette exposure and we may reach a point where we can't help the patient if he/she can't quit smoking.     She does not have sign copd.  I reviewed the Flethcher curve with her that basically says if you quit smoking when your best day FEV1 is still well preserved it is highly unlikely you will progress to severe disease and informed the patient there was no medication on the market that has proven to change the curve or the likelihood of progression  Needs pft's for baseline  Orders: Est. Patient Level IV (35573)  Problem # 3:  HYPERTENSION (ICD-401.9)  Her updated medication list for  this problem includes:    Bystolic 5 Mg Tabs (Nebivolol hcl) ..... One tablet daily  Marginally controlled on bystolic which hasn't really made any difference in airway control and since no evidence of airflow obstruction ok to restart metaprolol  when  run out of bystolic samples  Medications Added to Medication List This Visit: 1)  Prevacid Solutab 30 Mg Tbdp (Lansoprazole) .... By mouth daily. take one half hour before eating.- ran out 2)  Nexium 40 Mg Cpdr (Esomeprazole magnesium) .... By mouth daily. take one half hour before eating. 3)  Reglan 10 Mg Tabs (Metoclopramide hcl) .... By mouth before each meal and at bedtime.  Patient Instructions: 1)  Your lungs are in very good shape 2)  Most likely this is reflux related vocal cord dysfunction 3)  Stay on bystolic instead of metaprolol for now 4)  Add reglan 10mg  take one before each meal and at bedtime  5)  Please schedule a follow-up appointment in 6 weeks, sooner if needed  Prescriptions: REGLAN 10 MG  TABS (METOCLOPRAMIDE HCL) By mouth before each meal and at bedtime.  #120 x 11   Entered and Authorized by:   Nyoka Cowden MD   Signed by:   Nyoka Cowden MD on 10/20/2010   Method used:   Electronically to        [facility] (retail)       [address] [place], [place]  [address]       Ph: [phone]       Fax: [phone]   RxID:   [id number] NEXIUM 40 MG  CPDR (ESOMEPRAZOLE MAGNESIUM) By mouth daily. Take one half hour before eating.  #34 x 11   Entered and Authorized by:   Nyoka Cowden MD   Signed by:   Nyoka Cowden MD on 10/20/2010   Method used:   Electronically to        [facility] (retail)       [address] [place], [place]  [address]       Ph: [phone]       Fax: [phone]   RxID:   [id number]

## 2010-12-21 NOTE — Progress Notes (Signed)
Summary: lisinopril  Phone Note Refill Request Message from:  Fax from Pharmacy on January 18, 2010 9:21 AM  Refills Requested: Medication #1:  LISINOPRIL 20 MG TABS take 1 by mouth qd  Method Requested: Electronic Initial call taken by: Orlan Leavens,  January 18, 2010 9:21 AM    Prescriptions: LISINOPRIL 20 MG TABS (LISINOPRIL) take 1 by mouth qd  #30 x 5   Entered by:   Orlan Leavens   Authorized by:   Newt Lukes MD   Signed by:   Orlan Leavens on 01/18/2010   Method used:   Electronically to        [facility] (retail)       [address] [place], [place]  [address]       Ph: [phone]       Fax: [phone]   RxID:   [id number]

## 2010-12-21 NOTE — Assessment & Plan Note (Signed)
Summary: NEW ENDO CON/ GOITER/THYROID/NWS  #   Vital Signs:  Patient profile:   55 year old female Height:      63 inches (160.02 cm) Weight:      273 pounds (124.09 kg) O2 Sat:      96 % on Room air Temp:     99.4 degrees F (37.44 degrees C) oral Pulse rate:   103 / minute BP sitting:   140 / 96  (left arm) Cuff size:   large  Vitals Entered By: Josph Macho RMA (January 22, 2010 1:13 PM)  O2 Flow:  Room air CC: New Endo: Goiter/ Thyroid/ CF Is Patient Diabetic? Yes   Primary Provider:  Newt Lukes MD  CC:  New Endo: Goiter/ Thyroid/ CF.  History of Present Illness: pt states 6 months of intermittent fever, and intermittent slight associated swelling at the anterior neck.    Current Medications (verified): 1)  Advil 200 Mg Tabs (Ibuprofen) .... Use Prn 2)  Metformin Hcl 500 Mg Tabs (Metformin Hcl) .... Take 1 By Mouth Qd 3)  Lisinopril 20 Mg Tabs (Lisinopril) .... Take 1 By Mouth Qd 4)  Nucynta 100 Mg Tabs (Tapentadol Hcl) .... Take 1 Tid 5)  Pristiq 100 Mg Xr24h-Tab (Desvenlafaxine Succinate) .... Take 1 By Mouth Qd  Allergies (verified): 1)  ! * Droperidol  Past History:  Past Medical History: Last updated: 01/11/2010 Diabetes mellitus, type II GERD Hypertension OSA depression chronic pain - intercostal bilateral and back but soft tissue related, not arthritis  MD rooster: pain mgmt - [facility] in [place], [place] (will cont to travel there for this care)  Family History: Reviewed history from 01/11/2010 and no changes required. Family History of Alcoholism/Addiction (parent) Family History of Arthritis (grandparent) Family History of Colon CA 1st degree relative <60 (parent) Family History Hypertension (parent) Heart disease (parent) no goiter or other thyroid problem in immediate family, but it is in several members of her extended family  Social History: Reviewed history from 01/11/2010 and no changes required. 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         denies double vision, diarrhea, polyuria, tremor, and menopausal sxs.  she has lost 50 lbs, x 1 year.  she reports hoarseness, palpitations, doe, diarrhea, myalgias, excessive diaphoresis, numbness (pathy throughout the body),  rhinorrhea, and easy bruising.  depression is well-controlled.   Physical Exam  General:  morbidly obese.  no distress  Head:  head: no deformity eyes: no periorbital swelling, no proptosis external nose and ears are normal mouth: no lesion seen Neck:  i do not appreciate a goiter Lungs:  Clear to auscultation bilaterally. Normal respiratory effort.  Heart:  Regular rate and rhythm without murmurs or gallops noted. Normal S1,S2.   Msk:  muscle bulk and strength are grossly normal.  no obvious joint swelling.  gait is normal and steady  Pulses:  dorsalis pedis intact bilat.   Extremities:  no deformity.  no ulcer on the feet.  feet are of normal color and temp.  no edema  Neurologic:  cn 2-12 grossly intact.   readily moves all 4's.   sensation is intact to touch on the feet there is a tremor of the hands Skin:  normal texture and temp.  no rash.  not diaphoretic  Cervical Nodes:  No significant adenopathy.  Psych:  anxious.   Additional Exam:  FastTSH              [L]  0.07 uIU/mL     Impression & Recommendations:  Problem # 1:  GOITER, MULTINODULAR (ICD-241.1) Assessment Unchanged  Problem # 2:  HYPERTHYROIDISM (ICD-242.90) due to #1  Problem # 3:  HYPERTENSION (ICD-401.9) she can tolerate a b-blocker  Medications Added to Medication List This Visit: 1)  Metoprolol Succinate 25 Mg Xr24h-tab (Metoprolol succinate) .Marland Kitchen.. 1 once daily  Other Orders: Radiology Referral (Radiology) Consultation  Level IV [phone])  Patient Instructions: 1)  we discussed the causes, risks, and treatment options of hyperthyroidism (overactive thyroid) 2)  metoprolol-xr 25 mg once daily 3)  check scan, with tentative plan for i-131 rx. 4)  we discussed the possibility of methimazole while the i-131 is working.  i would be happy to prescribe. 5)  if ever you have fever while taking methimazole, stop it and call us, because of the risk of a rare side-effect. 6)  tentative plan is to return 6 weeks after the i-131 therapy Prescriptions: METOPROLOL SUCCINATE 25 MG XR24H-TAB (METOPROLOL SUCCINATE) 1 once daily  #30 x 5   Entered and Authorized by:   Minus Breeding MD   Signed by:   Minus Breeding MD on 01/22/2010   Method used:   Electronically to        [facility] (retail)       [address] [place], [place]  [address]       Ph: [phone]       Fax: [phone]   RxID:   [id number]

## 2010-12-21 NOTE — Assessment & Plan Note (Signed)
Summary: NEW / COVENTRY / #/ CD   Vital Signs:  Patient profile:   55 year old female Height:      63 inches (160.02 cm) Weight:      269.8 pounds (122.64 kg) BMI:     47.97 O2 Sat:      95 % on Room air Temp:     98.6 degrees F (37.00 degrees C) oral Pulse rate:   98 / minute BP sitting:   112 / 84  (left arm) Cuff size:   large  Vitals Entered By: Orlan Leavens (January 11, 2010 8:12 AM)  O2 Flow:  Room air CC: New patient Is Patient Diabetic? Yes Did you bring your meter with you today? No Pain Assessment Patient in pain? no        Primary Care Provider:  Newt Lukes MD  CC:  New patient.  History of Present Illness: new pt to me and our practice - here toe st care - prev followed by her docs in [place] but has moved to [place] area to be closer to her mom  1) abn TSH -  labs by her pain mgmt doc show undectable TSH with normal T3/T4 - no diarrhea but +50 lb weight loss in past 12 mos - rec to have thyroid US and ?poss uptake scan but pt changed incsurance and then moved out of area - copy of labs reviewed today -  2) c/o hoarseness - onset 4 months ago - no pain or trouble swallowing - +smoker - stopped using CPAP machine as felt the mask.tubing may be contrib to her symptoms but not improved - denies sinus drainges or allg symptoms or daily GERD/reflux  3) DM2 -  reports compliance with ongoing medical treatment and no changes in medication dose or frequency. denies adverse side effects related to current therapy. feels the dx is weight related and likely to improve with weight loss as ongoing -  no labs done in many mos for DM check  4) OSA - no longer usinf CPAP - see above under hoarsness - chronic daily fatigue but no "somnelnce" reports  Preventive Screening-Counseling & Management  Alcohol-Tobacco     Alcohol drinks/day: 0     Alcohol Counseling: not indicated; patient does not drink     Smoking Status: current     Tobacco Counseling: to quit  use of tobacco products  Caffeine-Diet-Exercise     Diet Counseling: to improve diet; diet is suboptimal     Does Patient Exercise: no     Exercise Counseling: to improve exercise regimen     Depression Counseling: not indicated; screening negative for depression  Safety-Violence-Falls     Seat Belt Use: yes     Helmet Use: n/a     Firearms in the Home: no firearms in the home     Smoke Detectors: yes     Violence in the Home: no risk noted     Fall Risk Counseling: not indicated; no significant falls noted  Clinical Review Panels:  Diabetes Management   HgBA1C:  5.5 (06/15/2009)  CBC   WBC:  14.7 (06/15/2009)   RBC:  5.61 (06/15/2009)   Hgb:  15.6 (06/15/2009)   Hct:  45.6 (06/15/2009)   Platelets:  395 (06/15/2009)   MCV  81 (06/15/2009)   RDW  13.2 (06/15/2009)   PMN:  59 (06/15/2009)   Monos:  5 (06/15/2009)   Eosinophils:  2 (06/15/2009)   Basophil:  0 (06/15/2009)   -  Date:  06/15/2009    HgbA1c: 5.5    TSH: 0.006    WBC: 14.7    HGB: 15.6    HCT: 45.6    RBC: 5.61    PLT: 395    MCV: 81    RDW: 13.2    Neutrophil: 59    Lymphs: 34    Monos: 5    Eos: 2    Basophil: 0  Current Medications (verified): 1)  Advil 200 Mg Tabs (Ibuprofen) .... Use Prn 2)  Metformin Hcl 500 Mg Tabs (Metformin Hcl) .... Take 1 By Mouth Qd 3)  Lisinopril 20 Mg Tabs (Lisinopril) .... Take 1 By Mouth Qd 4)  Nucynta 100 Mg Tabs (Tapentadol Hcl) .... Take 1 Tid 5)  Pristiq 100 Mg Xr24h-Tab (Desvenlafaxine Succinate) .... Take 1 By Mouth Qd  Allergies (verified): 1)  ! * Droperidol  Past History:  Past Medical History: Diabetes mellitus, type II GERD Hypertension OSA depression chronic pain - intercostal bilateral and back but soft tissue related, not arthritis  MD rooster: pain mgmt - [facility] in [place], [place] (will cont to travel there for this care)  Past Surgical History: Cholecystectomy (1992) uterine polyp removed (?2005)  Family History: Family History  of Alcoholism/Addiction (parent) Family History of Arthritis (grandparent) Family History of Colon CA 1st degree relative <60 (parent) Family History Hypertension (parent) Heart disease (parent)  Social History: Current Smoker; no alcohol divorced - moved to [place] to live with her mother - 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 I have reviewed all other systems and they were negative.   Physical Exam  General:  obese, alert, well-developed, well-nourished, and cooperative to examination.    Eyes:  vision grossly intact; pupils equal, round and reactive to light.  conjunctiva and lids normal.   wears glasses Ears:  normal pinnae bilaterally, without erythema, swelling, or tenderness to palpation. TMs clear, without effusion, or cerumen impaction. Hearing grossly normal bilaterally  Mouth:  teeth and gums in good repair; mucous membranes moist, without lesions or ulcers. oropharynx clear without exudate, erythema. 1 cm submucosal nodule in left OP Neck:  thick, supple, full ROM, no masses, no thyromegaly; no thyroid nodules or tenderness. no JVD or carotid bruits.   Lungs:  normal respiratory effort, no intercostal retractions or use of accessory muscles; normal breath sounds bilaterally - no crackles and no wheezes.    Heart:  normal rate, regular rhythm, no murmur, and no rub. BLE with trace edema. normal DP pulses and normal cap refill in all 4 extremities    Abdomen:  obese, soft, non-tender, normal bowel sounds, no distention; no masses and no appreciable hepatomegaly or splenomegaly.   Genitalia:  defer gyn Msk:  no joint effusions or deformaties Neurologic:  alert & oriented X3 and cranial nerves II-XII symetrically intact.  strength normal in all extremities, sensation intact to light touch, and gait  normal. speech fluent without dysarthria or aphasia; follows commands with good comprehension.  Skin:  no rashes, vesicles, ulcers, or erythema. No nodules or irregularity to palpation.  Psych:  Oriented X3, memory intact for recent and remote, subdued, but normally interactive, good eye contact, not anxious appearing, not depressed appearing, and not agitated.      Impression & Recommendations:  Problem # 1:  HOARSENESS [phone]) ongoing and progressive >3 mos in smoker -  refer for ENT eval r/o other problem (noted nodule in left OP on exam) Orders: ENT Referral (ENT)  Problem # 2:  THYROID STIMULATING HORMONE, ABNORMAL (ICD-246.9) ?thyroiditis (though no other systemic symptoms) recheck now and refer for [place] - ?need uptake study or endo eval depending on findings Orders: TLB-TSH (Thyroid Stimulating Hormone) (54098-JXB) Radiology Referral (Radiology)  Problem # 3:  LEUKOCYTOSIS UNSPECIFIED (ICD-288.60) labs reviewed - will recehck now - autoimmune w/u neg 05/2009 - labs reviewed and to be copied into EMR today Orders: TLB-CBC Platelet - w/Differential (85025-CBCD)  Problem # 4:  DIABETES MELLITUS, TYPE II (ICD-250.00)  Her updated medication list for this problem includes:    Metformin Hcl 500 Mg Tabs (Metformin hcl) .Marland Kitchen... Take 1 by mouth qd    Lisinopril 20 Mg Tabs (Lisinopril) .Marland Kitchen... Take 1 by mouth qd  Orders: TLB-Lipid Panel (80061-LIPID) TLB-BMP (Basic Metabolic Panel-BMET) (80048-METABOL) TLB-A1C / Hgb A1C (Glycohemoglobin) (83036-A1C) TLB-Microalbumin/Creat Ratio, Urine (82043-MALB)  Reviewed HgBA1c results: 5.5 (06/15/2009)  Problem # 5:  SLEEP APNEA (ICD-780.57)  Problem # 6:  UTERINE POLYP (ICD-621.0)  menopausal for >5 years but at risk for uterine ca due to obesity --  refer to gyn for followup and eval -   Problem # 7:  HYPERTENSION (ICD-401.9)  Her updated medication list for this problem includes:    Lisinopril 20 Mg Tabs (Lisinopril) .Marland Kitchen... Take 1  by mouth qd  Orders: TLB-BMP (Basic Metabolic Panel-BMET) (80048-METABOL)  BP today: 112/84  Problem # 8:  MORBID OBESITY (ICD-278.01) Assessment: Improved  reports 50# weight loss without sig effort in past year  Orders: ENT Referral (ENT) Gynecologic Referral (Gyn)  Ht: 63 (01/11/2010)   Wt: 269.8 (01/11/2010)   BMI: 47.97 (01/11/2010)  Problem # 9:  DEPRESSION (ICD-311) Assessment: Unchanged  Her updated medication list for this problem includes:    Pristiq 100 Mg Xr24h-tab (Desvenlafaxine succinate) .Marland Kitchen... Take 1 by mouth qd  Complete Medication List: 1)  Advil 200 Mg Tabs (Ibuprofen) .... Use prn 2)  Metformin Hcl 500 Mg Tabs (Metformin hcl) .... Take 1 by mouth qd 3)  Lisinopril 20 Mg Tabs (Lisinopril) .... Take 1 by mouth qd 4)  Nucynta 100 Mg Tabs (Tapentadol hcl) .... Take 1 tid 5)  Pristiq 100 Mg Xr24h-tab (Desvenlafaxine succinate) .... Take 1 by mouth qd  Patient Instructions: 1)  it was good to see you today. 2)  test(s) ordered today - your results will be posted on the phone tree for review in 48-72 hours from the time of test completion; call [phone] and enter your 9 digit MRN (listed above on this page, just below your name); if any changes need to be made or there are abnormal results, you will be contacted directly. 3)  we'll make referral to ENT and gynecology. Our office will contact you regarding this appointment once made.  4)  also we'll make referral for thyroid ultrasound . Our office will contact you regarding this appointment once made.  5)  no medication changes - continue as you are doing all medications 6)  Please schedule a follow-up appointment in 3-4 months, sooner if problems.

## 2010-12-21 NOTE — Assessment & Plan Note (Signed)
Summary: FU/ REFILLS /NWS   Vital Signs:  Patient profile:   55 year old female Height:      63 inches (160.02 cm) Weight:      271.8 pounds (123.55 kg) O2 Sat:      94 % on Room air Temp:     99.8 degrees F (37.67 degrees C) oral Pulse rate:   123 / minute BP sitting:   136 / 92  (left arm) Cuff size:   large  Vitals Entered By: Orlan Leavens RMA (August 31, 2010 3:36 PM)  O2 Flow:  Room air CC: follow-up visit Is Patient Diabetic? Yes Did you bring your meter with you today? No Comments Pt states been taking on 1/2 of meds due to finances   Primary Care Provider:  Newt Lukes MD  CC:  follow-up visit.  History of Present Illness: here for f/u  hyperthyroidism s/p RAI ablation 02/2010 -  since then, increase in diarrhea and palpitations, nervousness no weight loss in past 6 mos - has not been seen by endo since ablation  hoarseness - s/p eval by ENT for same 12/2009- no pain or trouble swallowing - but "hard to get air through my neck" +smoker -continues stopped using CPAP machine as felt the mask.tubing may be contrib to her symptoms but not improved - denies sinus drainges or allg symptoms or daily GERD/reflux  DM2 -  reports varaible compliance with ongoing medical treatment due to financial hardship; no rx'd changes in medication dose or frequency. denies adverse side effects related to current therapy. feels the dx is weight related and likely to improve with weight loss as ongoing -  no labs done in many mos for DM check  HTN - reports compliance with ongoing medical treatment and no changes in medication dose or frequency. denies adverse side effects related to current therapy.   OSA - no longer using CPAP - see above under hoarsness - chronic daily fatigue but no "somnelence"  Preventive Screening-Counseling & Management  Alcohol-Tobacco     Alcohol drinks/day: 0     Alcohol Counseling: not indicated; patient does not drink     Smoking Status:  current     Tobacco Counseling: to quit use of tobacco products  Clinical Review Panels:  Diabetes Management   HgBA1C:  6.3 (01/11/2010)   Creatinine:  0.5 (01/11/2010)   Current Medications (verified): 1)  Advil 200 Mg Tabs (Ibuprofen) .... Use Prn 2)  Metformin Hcl 500 Mg Tabs (Metformin Hcl) .... Take 1 By Mouth Qd 3)  Lisinopril 20 Mg Tabs (Lisinopril) .... Take 1 By Mouth Qd 4)  Nucynta 100 Mg Tabs (Tapentadol Hcl) .... Take 1 Tid 5)  Pristiq 100 Mg Xr24h-Tab (Desvenlafaxine Succinate) .... Take 1 By Mouth Qd 6)  Metoprolol Succinate 25 Mg Xr24h-Tab (Metoprolol Succinate) .Marland Kitchen.. 1 Once Daily  Allergies (verified): 1)  ! * Droperidol  Past History:  Past Medical History: Diabetes mellitus, type II GERD Hypertension OSA depression chronic pain - intercostal bilateral and back but soft tissue related, not arthritis hyperthyroidism s/p ablation 02/2010  MD roster: pain mgmt - [facility] in [place], [place] (will cont to travel there for this care) endo - [facility] - [facility]  Review of Systems  The patient denies anorexia, chest pain, syncope, and headaches.         c/o rash on right thumb, no other skin lesions - not responsive to steroid or topical abx therapy  Physical Exam  General:  obese, alert, well-developed, well-nourished,  and cooperative to examination.   hoarse and strained voice quality Neck:  thick, supple, full ROM, no masses, no thyromegaly; no thyroid nodules or tenderness. no JVD or carotid bruits.   Lungs:  normal respiratory effort, no intercostal retractions or use of accessory muscles; normal breath sounds bilaterally - no crackles and no wheezes. mild stridor on expiration in upper airway   Heart:  normal rate, regular rhythm, no murmur, and no rub. BLE with trace edema.  Skin:  excema-like rash on medial edge of right thumb Psych:  Oriented X3, memory intact for recent and remote, subdued, but normally interactive, fair eye contact, mildly anxious  appearing, not depressed appearing, and not agitated.      Impression & Recommendations:  Problem # 1:  HYPERTHYROIDISM (ICD-242.90)  s/p ablation tx 02/2010 for same - clinically with resumed symptoms -  recheck labs now and rec f/u with endo - increase Bbloc tx pending this eval  Her updated medication list for this problem includes:    Metoprolol Succinate 50 Mg Xr24h-tab (Metoprolol succinate) .Marland Kitchen... 1 by mouth once daily  Orders: TLB-TSH (Thyroid Stimulating Hormone) (84443-TSH)  Labs Reviewed: TSH: 0.07 (01/11/2010)     Problem # 2:  HOARSENESS (ZOX-096.04) s/p ENT eval for same 12/2009 - flex largynoscopy benign stop ACEI add PPI refer to pulm for eval - ?other pulm source of resp symptoms (also with OSA - needs eval and tx!) reminded of need to STOP smoking Orders: Pulmonary Referral (Pulmonary)  Problem # 3:  HYPERTENSION (ICD-401.9)  The following medications were removed from the medication list:    Lisinopril 20 Mg Tabs (Lisinopril) .Marland Kitchen... Take 1 by mouth qd Her updated medication list for this problem includes:    Metoprolol Succinate 50 Mg Xr24h-tab (Metoprolol succinate) .Marland Kitchen... 1 by mouth once daily  BP today: 136/92 Prior BP: 140/96 (01/22/2010)  Labs Reviewed: K+: 4.4 (01/11/2010) Creat: : 0.5 (01/11/2010)   Chol: 205 (01/11/2010)   HDL: 48.20 (01/11/2010)   TG: 277.0 (01/11/2010)  Problem # 4:  SLEEP APNEA (ICD-780.57)  Orders: Pulmonary Referral (Pulmonary)  Problem # 5:  DIABETES MELLITUS, TYPE II (ICD-250.00)  The following medications were removed from the medication list:    Lisinopril 20 Mg Tabs (Lisinopril) .Marland Kitchen... Take 1 by mouth qd Her updated medication list for this problem includes:    Metformin Hcl 500 Mg Tabs (Metformin hcl) .Marland Kitchen... Take 1 by mouth once daily  Orders: TLB-A1C / Hgb A1C (Glycohemoglobin) (83036-A1C)  Labs Reviewed: Creat: 0.5 (01/11/2010)    Reviewed HgBA1c results: 6.3 (01/11/2010)  5.5 (06/15/2009)  Complete  Medication List: 1)  Advil 200 Mg Tabs (Ibuprofen) .... Use as needed 2)  Metformin Hcl 500 Mg Tabs (Metformin hcl) .... Take 1 by mouth once daily 3)  Nucynta 100 Mg Tabs (Tapentadol hcl) .... Take 1 by mouth three times a day (per dr. Mindi Junker) 4)  Pristiq 100 Mg Xr24h-tab (Desvenlafaxine succinate) .... Take 1 by mouth once daily 5)  Metoprolol Succinate 50 Mg Xr24h-tab (Metoprolol succinate) .Marland Kitchen.. 1 by mouth once daily 6)  Omeprazole 20 Mg Cpdr (Omeprazole) .Marland Kitchen.. 1 by mouth once daily  Patient Instructions: 1)  it was good to see you today. 2)  test(s) ordered today - your results will be posted on the phone tree for review in 48-72 hours from the time of test completion; call [phone] and enter your 9 digit MRN (listed above on this page, just below your name); if any changes need to be made or there are abnormal  results, you will be contacted directly. 3)  stop lisinopril 4)  increase metoprolol -  5)  start omeprazole to help your voice by reducing acid/reflux that may be irritating the throat 6)  your prescriptions have been electronically submitted to your pharmacy. Please take as directed. Contact our office if you believe you're having problems with the medication(s).  7)  we'll make referral to local pulmonology for your breathing and sleep apnea. Our office will contact you regarding this appointment once made.  8)  schedule followup with dr. Everardo All next available 9)  stop smoking! 10)  Please schedule a follow-up appointment in 3 months to review diabetes and hoarseness, sooner if problems.  Prescriptions: OMEPRAZOLE 20 MG CPDR (OMEPRAZOLE) 1 by mouth once daily  #30 x 6   Entered and Authorized by:   Newt Lukes MD   Signed by:   Newt Lukes MD on 08/31/2010   Method used:   Electronically to        [facility] [phone]* (retail)       [address] [place], [place]  [address]       Ph: [phone]       Fax: [phone]    RxID:   [id number] METFORMIN HCL 500 MG TABS (METFORMIN HCL) take 1 by mouth once daily  #30 x 12   Entered and Authorized by:   Newt Lukes MD   Signed by:   Newt Lukes MD on 08/31/2010   Method used:   Electronically to        [facility] [phone]* (retail)       [address] [place], [place]  [address]       Ph: [phone]       Fax: [phone]   RxID:   [id number] METOPROLOL SUCCINATE 50 MG XR24H-TAB (METOPROLOL SUCCINATE) 1 by mouth once daily  #30 x 12   Entered and Authorized by:   Newt Lukes MD   Signed by:   Newt Lukes MD on 08/31/2010   Method used:   Electronically to        [facility] [phone]* (retail)       [address] [place], [place]  [address]       Ph: [phone]       Fax: [phone]   RxID:   [id number]

## 2010-12-23 NOTE — Assessment & Plan Note (Signed)
Summary: 3 MTH FU---STC   Vital Signs:  Patient profile:   55 year old female Height:      63 inches (160.02 cm) Weight:      276.4 pounds (125.64 kg) O2 Sat:      93 % on Room air Temp:     99.2 degrees F (37.33 degrees C) oral Pulse rate:   90 / minute BP sitting:   132 / 80  (left arm) Cuff size:   regular  Vitals Entered By: Orlan Leavens RMA (November 30, 2010 4:02 PM)  O2 Flow:  Room air CC: 3 month follow-up Is Patient Diabetic? Yes Did you bring your meter with you today? No Pain Assessment Patient in pain? no        Primary Care Provider:  Newt Lukes MD  CC:  3 month follow-up.  History of Present Illness: here for f/u  hyperthyroidism s/p RAI ablation 02/2010 - feels overwhelming fatigue, no diarrhea or weight change in past 6 mos -  hoarseness - s/p eval by ENT for same 12/2009- pulm eval for same 09/2010 no pain or trouble swallowing - but "hard to get air through my neck" +smoker -continues same stopped using CPAP machine as felt the mask.tubing may be contrib to her symptoms but not improved - denies sinus drainges or allg symptoms or daily GERD/reflux  DM2 -  reports varaible compliance with ongoing medical treatment due to financial hardship; no rx'd changes in medication dose or frequency. denies adverse side effects related to current therapy. feels the dx is weight related and likely to improve with weight loss as ongoing -    HTN - reports compliance with ongoing medical treatment and no changes in medication dose or frequency. denies adverse side effects related to current therapy.   OSA - no longer using CPAP - see above under hoarsness - chronic daily fatigue but no "somnelence"  Clinical Review Panels:  Diabetes Management   HgBA1C:  6.2 (08/31/2010)   Creatinine:  0.7 (09/02/2010)  CBC   WBC:  12.5 (09/02/2010)   RBC:  5.34 (09/02/2010)   Hgb:  15.9 (09/02/2010)   Hct:  46.3 (09/02/2010)   Platelets:  345.0 (09/02/2010)   MCV   86.8 (09/02/2010)   MCHC  34.3 (09/02/2010)   RDW  13.5 (09/02/2010)   PMN:  57.8 (09/02/2010)   Lymphs:  34.1 (09/02/2010)   Monos:  4.9 (09/02/2010)   Eosinophils:  2.9 (09/02/2010)   Basophil:  0.3 (09/02/2010)  Complete Metabolic Panel   Glucose:  110 (09/02/2010)   Sodium:  138 (09/02/2010)   Potassium:  4.7 (09/02/2010)   Chloride:  100 (09/02/2010)   CO2:  29 (09/02/2010)   BUN:  13 (09/02/2010)   Creatinine:  0.7 (09/02/2010)   Calcium:  9.5 (09/02/2010)   Current Medications (verified): 1)  Metformin Hcl 500 Mg Tabs (Metformin Hcl) .... Take 1 By Mouth Once Daily 2)  Nucynta 100 Mg Tabs (Tapentadol Hcl) .... Take 1 By Mouth Three Times A Day (Per Dr. Mindi Junker) 3)  Pristiq 100 Mg Xr24h-Tab (Desvenlafaxine Succinate) .... Take 1 By Mouth Once Daily 4)  Pepcid 20 Mg Tabs (Famotidine) .... One At Bedtime 5)  Bystolic 5 Mg  Tabs (Nebivolol Hcl) .... One Tablet Daily 6)  Nexium 40 Mg  Cpdr (Esomeprazole Magnesium) .... By Mouth Daily. Take One Half Hour Before Eating. 7)  Reglan 10 Mg  Tabs (Metoclopramide Hcl) .... By Mouth Before Each Meal and At Bedtime. 8)  Tramadol Hcl 50  Mg  Tabs (Tramadol Hcl) .... One To Two By Mouth Every 4-6 Hours As Needed 9)  Advil 200 Mg Tabs (Ibuprofen) .... Use As Needed  Allergies (verified): 1)  ! * Droperidol  Past History:  Past Medical History: Diabetes mellitus, type II GERD Hypertension      - Off ACE  08/31/10 OSA - noncompliant with cpap due to hoarseness and cough depression chronic pain - intercostal bilateral and back but soft tissue related, not arthritis hyperthyroidism s/p I131ablation 02/24/2010 DOE      - PFT's wnl x poor insp loop October 20, 2010   MD roster: pain mgmt - [facility] in [place], [place] (will cont to travel there for this care) endo - [facility] E[facility] - [facility]sen Pulmonary - [facility]  Social History: Current smoker since age 12.   Smoking on and off since then- 1/2 ppd. 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  The patient denies fever, chest pain, syncope, peripheral edema, and abdominal pain.         noted pulsing in right side of neck and nocturnal HA symptoms - planning MRI or CT neck for same per her pain doctor in [place] -   Physical Exam  General:  obese, alert, well-developed, well-nourished, and cooperative to examination.   hoarse and strained voice quality Lungs:  normal respiratory effort, no intercostal retractions or use of accessory muscles; normal breath sounds bilaterally - no crackles and no wheezes. mild stridor on expiration in upper airway   Heart:  normal rate, regular rhythm, no murmur, and no rub. BLE with trace edema.    Impression & Recommendations:  Problem # 1:  HEADACHE (ICD-784.0) planning MRI or CT neck due to pulsing pain in right neck -  we can order if [facility] not [facility] by [facility] (copy rx for emr)  Her updated medication list for this problem includes:    Nucynta 100 Mg Tabs (Tapentadol hcl) .Marland Kitchen... Take 1 by mouth three times a day (per dr. Mindi Junker)    Bystolic 5 Mg Tabs (Nebivolol hcl) ..... One tablet daily    Tramadol Hcl 50 Mg Tabs (Tramadol hcl) ..... One to two by mouth every 4-6 hours as needed    Advil 200 Mg Tabs (Ibuprofen) ..... Use as needed  Problem # 2:  DIABETES MELLITUS, TYPE II (ICD-250.00)  off ACEI due to cough/hoarse 09/2010  Her updated medication list for this problem includes:    Metformin Hcl 500 Mg Tabs (Metformin hcl) .Marland Kitchen... Take 1 by mouth once daily  Orders: TLB-A1C / Hgb A1C (Glycohemoglobin) (83036-A1C)  Labs Reviewed: Creat: 0.7 (09/02/2010)    Reviewed HgBA1c results: 6.2 (08/31/2010)  6.3 (01/11/2010)  Problem # 3:  HYPERTHYROIDISM (ICD-242.90)  Her updated medication list for this problem includes:    Bystolic 5 Mg Tabs (Nebivolol  hcl) ..... One tablet daily  Orders: TLB-TSH (Thyroid Stimulating Hormone) [phone])  s/p ablation tx 02/2010 for same - clinically with hypoactive symptoms?  recheck labs now cont Bbloc tx   Labs Reviewed: TSH: 1.70 (08/31/2010)    TSH: 0.07 (01/11/2010)     Problem # 4:  HYPERTENSION (ICD-401.9)  Her updated medication list for this problem includes:    Bystolic 5 Mg Tabs (Nebivolol hcl) ..... One tablet daily per pulm  lat OV: Marginally controlled on bystolic which hasn't really made any difference in airway control and since no evidence of airflow obstruction ok to restart metaprolol  when  run out of bystolic samples  BP today: 132/80 Prior BP: 140/88 (10/20/2010)  Labs Reviewed: K+: 4.7 (09/02/2010) Creat: : 0.7 (09/02/2010)   Chol: 205 (01/11/2010)   HDL: 48.20 (01/11/2010)   TG: 277.0 (01/11/2010)  Complete Medication List: 1)  Metformin Hcl 500 Mg Tabs (Metformin hcl) .... Take 1 by mouth once daily 2)  Nucynta 100 Mg Tabs (Tapentadol hcl) .... Take 1 by mouth three times a day (per dr. Mindi Junker) 3)  Pristiq 100 Mg Xr24h-tab (Desvenlafaxine succinate) .... Take 1 by mouth once daily 4)  Pepcid 20 Mg Tabs (Famotidine) .... One at bedtime 5)  Bystolic 5 Mg Tabs (Nebivolol hcl) .... One tablet daily 6)  Nexium 40 Mg Cpdr (Esomeprazole magnesium) .... By mouth daily. take one half hour before eating. 7)  Reglan 10 Mg Tabs (Metoclopramide hcl) .... By mouth before each meal and at bedtime. 8)  Tramadol Hcl 50 Mg Tabs (Tramadol hcl) .... One to two by mouth every 4-6 hours as needed 9)  Advil 200 Mg Tabs (Ibuprofen) .... Use as needed  Patient Instructions: 1)  it was good to see you today. 2)  test(s) ordered today - your results will be posted on the phone tree for review in 48-72 hours from the time of test completion; call [phone] and enter your 9 digit MRN (listed above on this page, just below your name); if any changes need to be made or there are abnormal  results, you will be contacted directly. 3)  let me know if local imaging will accept order from dr. Mindi Junker for MRI or CT of neck - if not, we can order this and send results to him 4)  continue to follow with dr. Sherene Sires as ongoing 5)  stop smoking! 6)  Please schedule a follow-up appointment in 4-6 months to review diabetes, thyroid and hoarseness, call sooner if problems.    Orders Added: 1)  TLB-TSH (Thyroid Stimulating Hormone) [84443-TSH] 2)  TLB-A1C / Hgb A1C (Glycohemoglobin) [83036-A1C] 3)  Est. Patient Level IV [09811]

## 2010-12-30 ENCOUNTER — Encounter: Payer: Self-pay | Admitting: Internal Medicine

## 2010-12-30 ENCOUNTER — Ambulatory Visit (INDEPENDENT_AMBULATORY_CARE_PROVIDER_SITE_OTHER): Payer: No Typology Code available for payment source | Admitting: Internal Medicine

## 2010-12-30 DIAGNOSIS — R0609 Other forms of dyspnea: Secondary | ICD-10-CM

## 2010-12-30 DIAGNOSIS — R0989 Other specified symptoms and signs involving the circulatory and respiratory systems: Secondary | ICD-10-CM | POA: Insufficient documentation

## 2010-12-30 DIAGNOSIS — F17200 Nicotine dependence, unspecified, uncomplicated: Secondary | ICD-10-CM

## 2010-12-30 HISTORY — DX: Other forms of dyspnea: R06.09

## 2010-12-30 HISTORY — DX: Other specified symptoms and signs involving the circulatory and respiratory systems: R09.89

## 2010-12-31 ENCOUNTER — Other Ambulatory Visit: Payer: Self-pay | Admitting: Internal Medicine

## 2010-12-31 DIAGNOSIS — K219 Gastro-esophageal reflux disease without esophagitis: Secondary | ICD-10-CM

## 2011-01-06 ENCOUNTER — Encounter: Payer: Self-pay | Admitting: Internal Medicine

## 2011-01-06 ENCOUNTER — Ambulatory Visit (HOSPITAL_COMMUNITY)
Admission: RE | Admit: 2011-01-06 | Discharge: 2011-01-06 | Disposition: A | Discharge status: Home / Self Care | Payer: No Typology Code available for payment source | Source: Ambulatory Visit | Attending: Internal Medicine | Admitting: Internal Medicine

## 2011-01-06 DIAGNOSIS — R1310 Dysphagia, unspecified: Secondary | ICD-10-CM | POA: Insufficient documentation

## 2011-01-06 DIAGNOSIS — K219 Gastro-esophageal reflux disease without esophagitis: Secondary | ICD-10-CM

## 2011-01-06 NOTE — Assessment & Plan Note (Signed)
Summary: Pulmonary/ ext ov > GI eval next step   Copy to:  Newt Lukes MD Primary Provider/Referring Provider:  Newt Lukes MD  CC:  Dyspnea- the same.  History of Present Illness: 52 yowf  morbid obesity disabled RN  active smoker with no breathing problems except nasal allergies until around 2010 while in [place] on ACE inhhibitors chronically.  September 24, 2010  1st pulmonary office eval cc cough/hoarseness x 2 years>  seen by Pollyann Kennedy "who felt problem was thyroid"  rx with I 131 .    No better off lisnopril x 4weeks on initial pulmonary eval. cough is dry, day > night with doe x > 50 ft without much variation. Prevacid 15 mg  2  30-60 min before first and last meals of the day  Pepcid 20mg  one at bedtime Stop metaprolol and replace with bystolic 5 mg one daily Tramadol 50 mg every 4 hours if needed for cough GERD (REFLUX)  diet   October 20, 2010 ov c/o doe x hc parking mail box and back, also still hoarse, not surpressing cough with tramadol, not taking ppi as directed. 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  Pt denies any significant sore throat,   itching, sneezing,  nasal congestion or excess secretions,  fever, chills, sweats, unintended wt loss, pleuritic or exertional cp, hempoptysis, variability  in activity tolerance  orthopnea pnd or leg swelling    Current Medications (verified): 1)  Metformin Hcl 500 Mg Tabs (Metformin Hcl) .... Take 1 By Mouth Once Daily 2)  Nucynta 100 Mg Tabs (Tapentadol Hcl) .... Take 1 By Mouth Three Times A Day (Per Dr. Mindi Junker) 3)  Pristiq 100 Mg Xr24h-Tab (Desvenlafaxine Succinate)  .... Take 1 By Mouth Once Daily 4)  Pepcid 20 Mg Tabs (Famotidine) .... One At Bedtime 5)  Bystolic 5 Mg  Tabs (Nebivolol Hcl) .... One Tablet Daily-Ran Out 6)  Nexium 40 Mg  Cpdr (Esomeprazole Magnesium) .... By Mouth Daily. Take One Half Hour Before Eating. 7)  Reglan 10 Mg  Tabs (Metoclopramide Hcl) .... By Mouth Before Each Meal and At Bedtime. 8)  Tramadol Hcl 50 Mg  Tabs (Tramadol Hcl) .... One To Two By Mouth Every 4-6 Hours As Needed 9)  Advil 200 Mg Tabs (Ibuprofen) .... Use As Needed  Allergies (verified): 1)  ! * Droperidol  Past History:  Past Medical History: Diabetes mellitus, type II GERD Hypertension      - Off  ACE  08/31/10 OSA - noncompliant with cpap due to hoarseness and cough depression chronic pain - intercostal bilateral and back but soft tissue related, not arthritis hyperthyroidism s/p I131ablation 02/24/2010 DOE      - PFT's wnl x poor insp loop October 20, 2010   MD roster: pain mgmt - [facility] in [place], [place] (will cont to travel there for this care) endo - [facility] ENT - [facility]sen Pulmonary - Zong Mcquarrie  Vital Signs:  Patient profile:   55 year old female Weight:      272 pounds O2 Sat:      96 % on Room air Temp:  98.1 degrees F oral Pulse rate:   81 / minute BP sitting:   102 / 80  (left arm) Cuff size:   large  Vitals Entered By: Vernie Murders (December 30, 2010 3:56 PM)  O2 Flow:  Room air  Physical Exam  Additional Exam:  obese depressed amb wf with severe hoarseness to point of resting stridor wt 274 > 276 September 24, 2010 > 273 October 20, 2010 > 272 December 30 2010  HEENT mild turbinate edema.  Oropharynx no thrush or excess pnd or cobblestoning.  No JVD or cervical adenopathy. Mild accessory muscle hypertrophy. Trachea midline, nl thryroid. Chest was hyperinflated by percussion with diminished breath sounds and moderate increased exp time without wheeze. Hoover sign positive at mid inspiration. Regular rate and rhythm without murmur  gallop or rub or increase P2 or edema.  Abd: no hsm, nl excursion. Ext warm without cyanosis or clubbing.     Impression & Recommendations:  Problem # 1:  DYSPNEA (ICD-786.09)     DDX of  difficult airways managment all start with A and  include Adherence, Ace Inhibitors, Acid Reflux, Active Sinus Disease, Alpha 1 Antitripsin deficiency, Anxiety masquerading as Airways dz,  ABPA,  allergy(esp in young), Aspiration (esp in elderly), Adverse effects of DPI,  Active smokers, plus two Bs  = Bronchiectasis and Beta blocker use..and one C= CHF     ? Anxiety /vcd or ? acid reflux VCD This is VCD until proven otherwise and since already underwent ent eval rec proceed to GI eval off reglan  Active smoking see #2     Problem # 2:  TOBACCO ABUSE (ICD-305.1)   I emphasized that although we never turn away smokers from the pulmonary clinic, we do ask that they understand that the recommendations that were made won't work nearly as well in the presence of continued cigarette exposure and we may reach a point where we can't help the patient if he/she can't quit smoking.       Medications Added to Medication List This Visit: 1)  Bystolic 5 Mg Tabs (Nebivolol hcl) .... One tablet daily-ran out  Other Orders: Misc. Referral (Misc. Ref) Est. Patient Level IV (30865)  Clinical Reports Reviewed:  CXR:  09/24/2010: CXR Results:  Findings: No active infiltrate or effusion is seen.  The lungs are clear.  Slightly prominent interstitial markings most likely are chronic.  Mediastinal contours appear normal.  The heart is mildly enlarged.  There are degenerative changes throughout the thoracic spine.   IMPRESSION: Cardiomegaly.  Probable mild chronic interstitial change.  No definite active process.   Patient Instructions: 1)  See Patient Care Coordinator before leaving for swallowing evaluation 2)  stop reglan now 3)  we will call you after we look at the results of the swallowing eval

## 2011-01-27 IMAGING — US US SOFT TISSUE HEAD/NECK
1 series · 14 of 25 positions shown · non-contrast
Comparison: None.

CLINICAL DATA: Abnormal thyroid stimulating hormone.  Question
thyroid nodule or thyroiditis.

THYROID ULTRASOUND
TECHNIQUE: Ultrasound examination of the thyroid gland and
adjacent soft tissues was performed.

[Series 1: us soft tissue head/neck · 0.11mm/px · 14 of 76 slices shown]
[im 1/76]
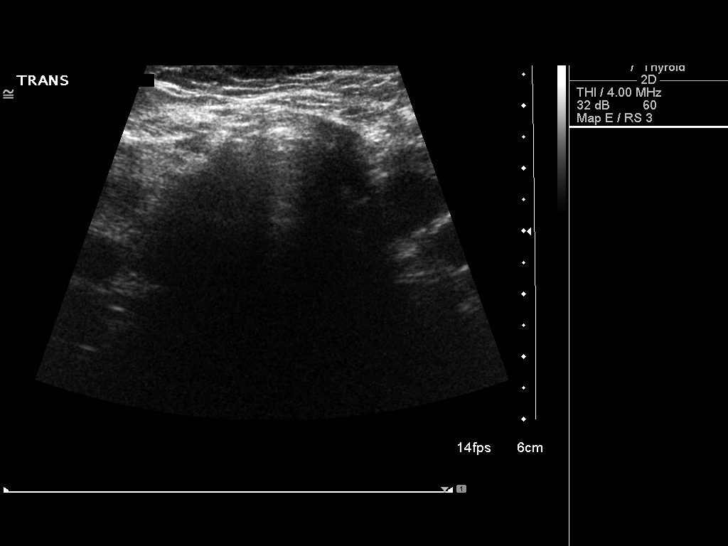
[im 7/76]
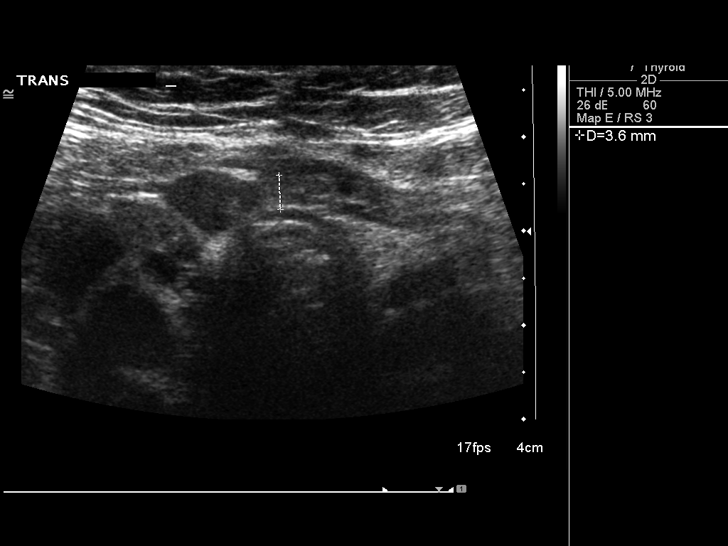
[im 13/76]
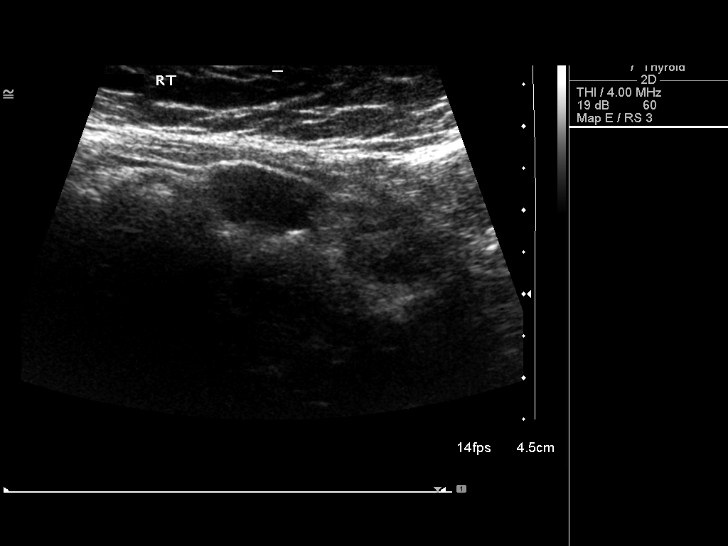
[im 19/76]
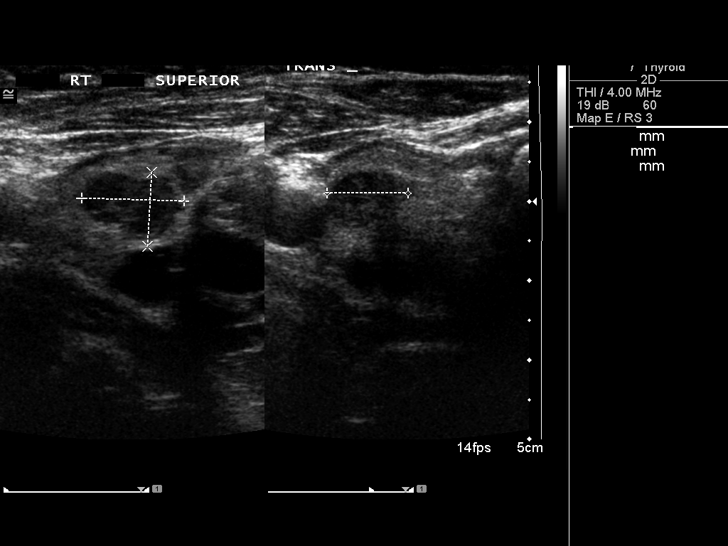
[im 26/76]
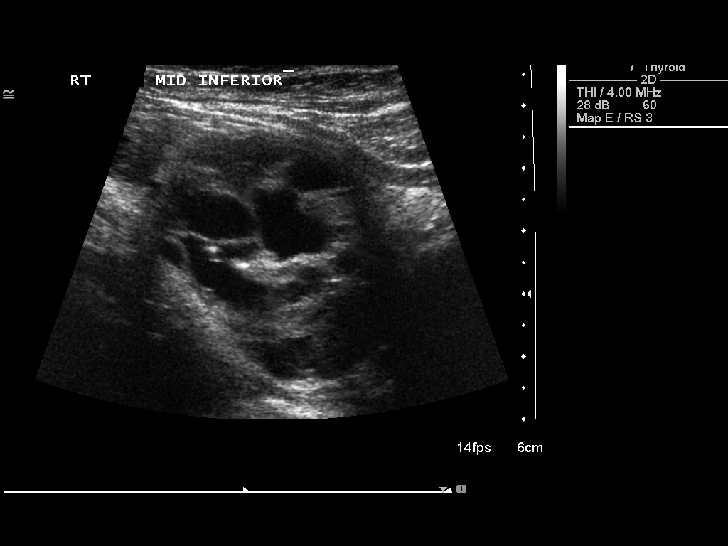
[im 29/76]
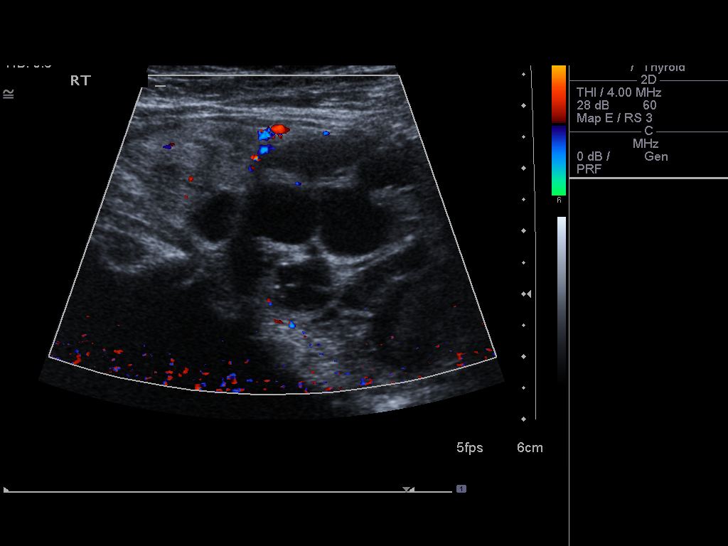
[im 35/76]
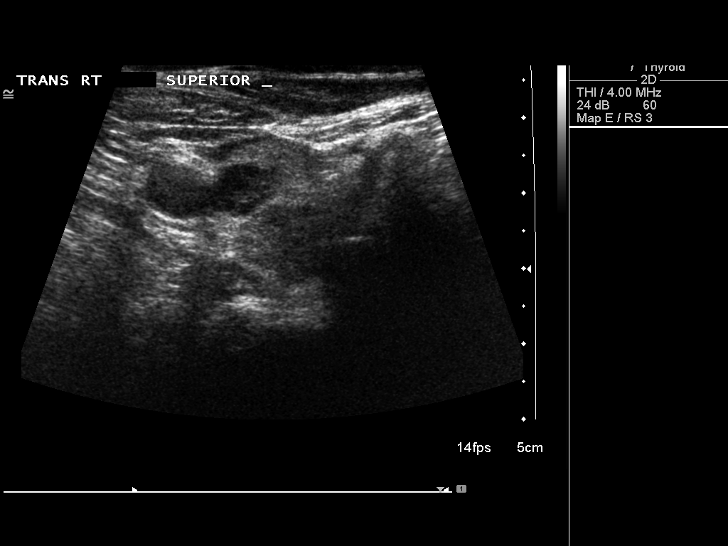
[im 41/76]
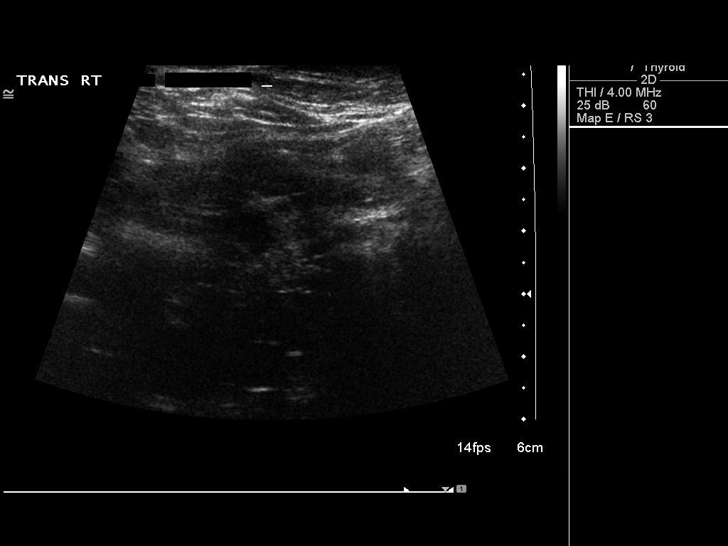
[im 47/76]
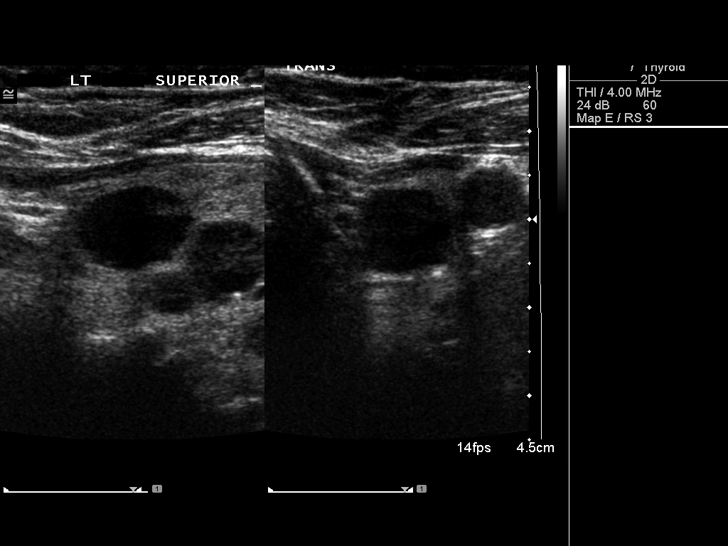
[im 51/76]
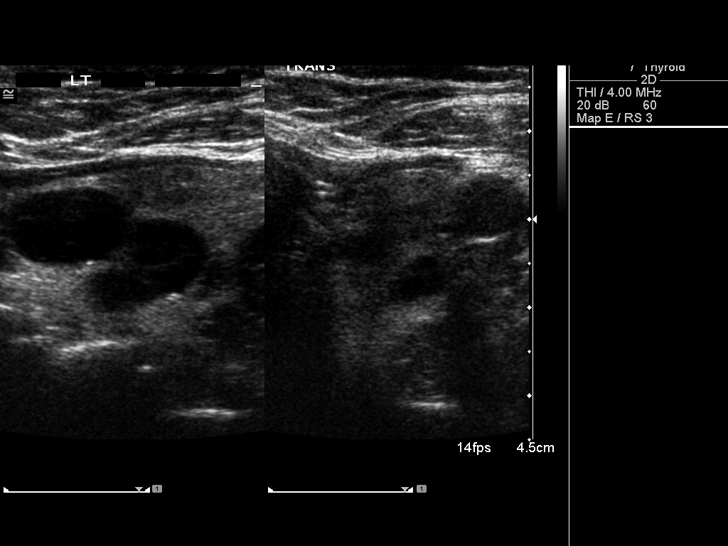
[im 57/76]
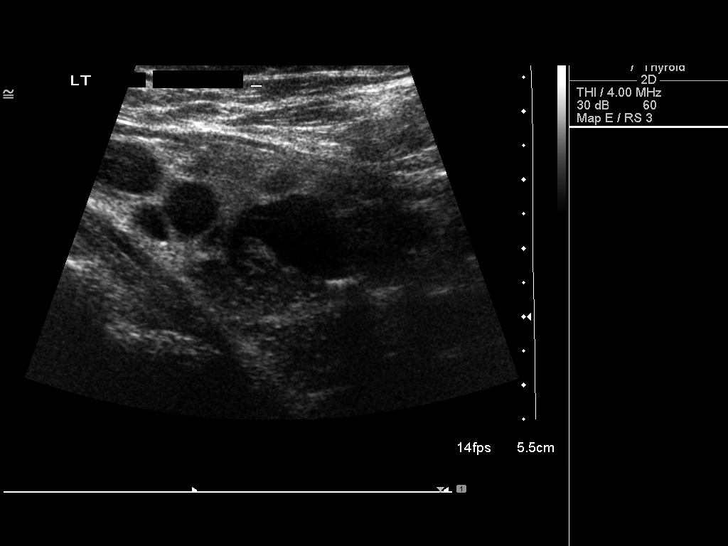
[im 63/76]
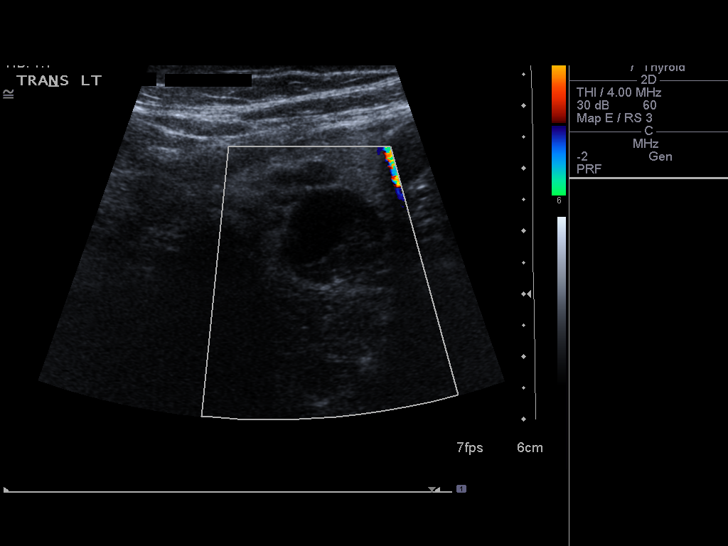
[im 69/76]
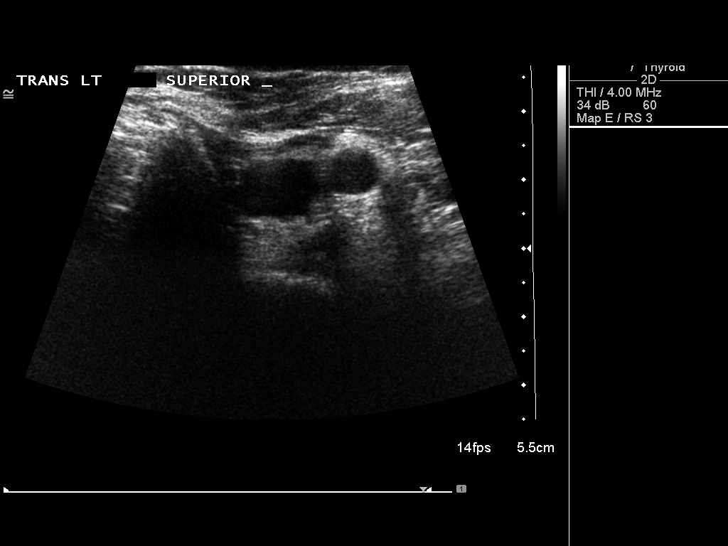
[im 76/76]
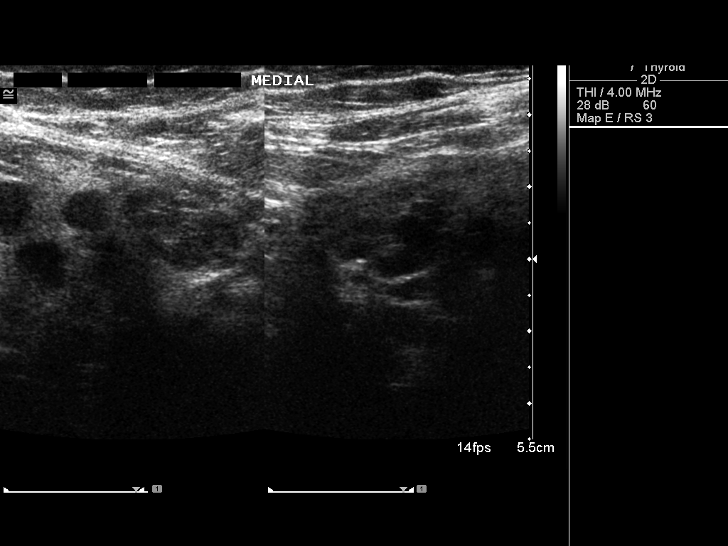

[14 of 25 positions shown; findings below may reference images not displayed]

FINDINGS: Generalized thyroid enlargement with heterogeneous
thyroid echotexture noted.  Right lobe thyroid measures 6.4 cm long
X 3.6 cm AP X 4.1 cm wide and left lobe 6.2 cm long X 2.8 cm AP X
2.1 cm wide.  Isthmus measures 4 mm AP thickness.  Numerous
bilateral solid and cystic nodules are seen.  The largest mid to
lower pole right lobe solid and cystic nodule measures 4.5 cm long
X 3.3 cm AP X 3.7 cm wide.  Slight central coarse calcifications
seen related to this nodule.  The dominant lower pole left lobe
thyroid nodule measures 3.6 cm long X 2.9 cm AP X 2.1 cm wide with
solid and cystic findings and coarse calcifications.
IMPRESSION: Probable multinodular goiter.  Consider nuclear medicine thyroid
scan for further evaluation of possible hyperfunctioning thyroid
adenoma versus toxic goiter/hyperthyroidism as clinically
indicated.

## 2011-04-05 ENCOUNTER — Ambulatory Visit: Payer: Self-pay | Admitting: Internal Medicine

## 2011-07-06 ENCOUNTER — Encounter: Payer: Self-pay | Admitting: Internal Medicine

## 2011-07-06 DIAGNOSIS — F329 Major depressive disorder, single episode, unspecified: Secondary | ICD-10-CM

## 2011-10-25 ENCOUNTER — Encounter: Payer: Self-pay | Admitting: Endocrinology

## 2012-05-21 ENCOUNTER — Ambulatory Visit: Payer: Self-pay | Admitting: Family Medicine

## 2012-05-21 VITALS — BP 182/112 | HR 90 | Temp 99.0°F | Resp 20 | Ht 64.0 in | Wt 260.0 lb

## 2012-05-21 DIAGNOSIS — M2550 Pain in unspecified joint: Secondary | ICD-10-CM

## 2012-05-21 DIAGNOSIS — L308 Other specified dermatitis: Secondary | ICD-10-CM

## 2012-05-21 DIAGNOSIS — R635 Abnormal weight gain: Secondary | ICD-10-CM

## 2012-05-21 DIAGNOSIS — I10 Essential (primary) hypertension: Secondary | ICD-10-CM

## 2012-05-21 DIAGNOSIS — L408 Other psoriasis: Secondary | ICD-10-CM

## 2012-05-21 LAB — COMPREHENSIVE METABOLIC PANEL
ALT: 12 U/L (ref 0–35)
AST: 15 U/L (ref 0–37)
Albumin: 4.6 g/dL (ref 3.5–5.2)
Alkaline Phosphatase: 75 U/L (ref 39–117)
BUN: 19 mg/dL (ref 6–23)
CO2: 29 mEq/L (ref 19–32)
Calcium: 10 mg/dL (ref 8.4–10.5)
Chloride: 103 mEq/L (ref 96–112)
Creat: 0.75 mg/dL (ref 0.50–1.10)
Glucose, Bld: 90 mg/dL (ref 70–99)
Potassium: 4.9 mEq/L (ref 3.5–5.3)
Sodium: 141 mEq/L (ref 135–145)
Total Bilirubin: 0.3 mg/dL (ref 0.3–1.2)
Total Protein: 7.3 g/dL (ref 6.0–8.3)

## 2012-05-21 LAB — POCT SEDIMENTATION RATE: POCT SED RATE: 25 mm/hr — AB (ref 0–22)

## 2012-05-21 LAB — TSH: TSH: 2.406 u[IU]/mL (ref 0.350–4.500)

## 2012-05-21 MED ORDER — METOPROLOL TARTRATE 100 MG PO TABS: 100.0000 mg | ORAL_TABLET | Freq: Two times a day (BID) | ORAL | Status: DC

## 2012-05-21 NOTE — Progress Notes (Signed)
@[email or web address] @  Patient ID: Victoria [name] MRN: [id number], DOB: Dec 18, 1955, 56 y.o. Date of Encounter: 05/21/2012, 3:49 PM  Primary Physician: Rene Paci, MD  Chief Complaint: HTN  HPI: 56 y.o. year old female with history below presents for hypertension follow up.  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 Has gained 10 lbs. Since January.  No thyroid check in 2 years. Joints have been sore for 2 months:  Wrists, fingers, ankles. Now has a 1 month hx of hypertension with chest tightness, headaches.  She checks her BP at home, sometimes over 200 systolic. Last A1C was 6.1 Patient is visiting mother indefinitely.    No  visual changes, or focal deficits.   Past Medical History  Diagnosis Date  . CARDIAC ARRHYTHMIA 01/11/2010  . DEPRESSION 01/11/2010  . DIABETES MELLITUS, TYPE II 01/11/2010  . DYSPNEA 12/30/2010  . GERD 01/11/2010  . GOITER, MULTINODULAR 01/22/2010  . Headache 11/30/2010  . HYPERTENSION 01/11/2010  . HYPERTHYROIDISM 01/22/2010  . LEUKOCYTOSIS UNSPECIFIED 01/11/2010  . Morbid obesity 01/11/2010  . SLEEP APNEA 01/11/2010  . TOBACCO ABUSE 01/11/2010  . UTERINE POLYP 01/11/2010     Home Meds: Prior to Admission medications   Medication Sig Start Date End Date Taking? Authorizing Provider  desvenlafaxine (PRISTIQ) 100 MG 24 hr tablet Take 100 mg by mouth daily.     Yes Historical Provider, MD  ibuprofen (ADVIL,MOTRIN) 200 MG tablet Take 200 mg by mouth as needed.     Yes Historical Provider, MD  traMADol (ULTRAM) 50 MG tablet Take 50 mg by mouth every 6 (six) hours as needed.     Yes Historical Provider, MD  esomeprazole (NEXIUM) 40 MG capsule Take 40 mg by mouth daily before breakfast.      Historical Provider, MD  famotidine (PEPCID) 20 MG tablet Take 20 mg by mouth at bedtime.      Historical Provider, MD  metFORMIN  (GLUCOPHAGE) 500 MG tablet Take 500 mg by mouth daily.      Historical Provider, MD  nebivolol (BYSTOLIC) 5 MG tablet Take 5 mg by mouth daily.      Historical Provider, MD  Tapentadol HCl (NUCYNTA) 100 MG TABS Take by mouth 3 (three) times daily.      Historical Provider, MD    Allergies:  Allergies  Allergen Reactions  . Droperidol     History   Social History  . Marital Status: Divorced    Spouse Name: N/A    Number of Children: N/A  . Years of Education: N/A   Occupational History  . Not on file.   Social History Main Topics  . Smoking status: Current Everyday Smoker -- 0.5 packs/day for 20 years    Types: Cigarettes  . Smokeless tobacco: Not on file   Comment: Divorced-moved to [place] to live with her mother  . Alcohol Use: No  . Drug Use: No  . Sexually Active:    Other Topics Concern  . 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 Emphysema Mother   . Alcohol abuse Other     parent not sure which 1  . Arthritis Other  grandparent not sure which 1  . Colon cancer Other     Parent not sure which 1  . Heart disease Other     Review of Systems: Constitutional: negative for chills, fever, night sweats, weight changes, or fatigue  HEENT: negative for vision changes, hearing loss, congestion, rhinorrhea, ST, epistaxis, or sinus pressure Cardiovascular: .  Notes chest tightness and palpitations with h/o PAT, mild DOE Respiratory: negative for hemoptysis, wheezing,or cough Abdominal: negative for abdominal pain, nausea, vomiting, diarrhea, or constipation Dermatological: negative for rash Neurologic: negative for dizziness, or syncope.  Headache in late afternoon All other systems reviewed and are otherwise negative with the exception to those above and in the HPI.   Physical Exam:  Morbidly obese,  152/96 Blood pressure 182/112, pulse 90, temperature 99 F (37.2 C), temperature source Oral, resp. rate 20, height 5\' 4"  (1.626 m), weight 260 lb (117.935 kg), SpO2 96.00%., Body mass index is 44.63 kg/(m^2). General: Well developed, well nourished, in no acute distress. Head: Normocephalic, atraumatic, eyes without discharge, sclera non-icteric, nares are without discharge. Bilateral auditory canals clear, TM's are without perforation, pearly grey and translucent with reflective cone of light bilaterally. Oral cavity moist, posterior pharynx without exudate, erythema, peritonsillar abscess, or post nasal drip.  Neck: Supple. No thyromegaly. Full ROM. No lymphadenopathy. No carotid bruits. Lungs: Clear bilaterally to auscultation without wheezes, rales, or rhonchi. Breathing is unlabored. Heart: RRR with S1 S2. No murmurs, rubs, or gallops appreciated.  Abdomen: Soft, non-tender, non-distended with normoactive bowel sounds. No hepatosplenomegaly. No rebound/guarding. No obvious abdominal masses. Msk:  Strength and tone normal for age. Extremities/Skin: Warm and dry. No clubbing or cyanosis. No edema. No rashes or suspicious lesions. Distal pulses 2+ and equal bilaterally. Neuro: Alert and oriented X 3. Moves all extremities spontaneously. Gait is normal. CNII-XII grossly in tact. DTR 2+, cerebellar function intact. Rhomberg normal. Psych:  Responds to questions appropriately with a normal affect.  Psoriatic lesion left dorsal thumb prox phalanx and scaly scalp Labs:  CMP, ESR pending  ASSESSMENT AND PLAN:  56 y.o. year old female with hypertension and possible psoriatic arthritis -  Signed, Elvina Sidle, MD 05/21/2012 3:49 PM

## 2012-08-30 ENCOUNTER — Other Ambulatory Visit: Payer: Self-pay | Admitting: *Deleted

## 2012-08-30 ENCOUNTER — Ambulatory Visit
Admission: RE | Admit: 2012-08-30 | Discharge: 2012-08-30 | Disposition: A | Discharge status: Home / Self Care | Payer: No Typology Code available for payment source | Source: Ambulatory Visit | Attending: *Deleted | Admitting: *Deleted

## 2012-08-30 DIAGNOSIS — L4050 Arthropathic psoriasis, unspecified: Secondary | ICD-10-CM

## 2013-05-08 ENCOUNTER — Other Ambulatory Visit: Payer: Self-pay | Admitting: Family Medicine

## 2013-07-03 ENCOUNTER — Ambulatory Visit: Payer: Self-pay | Admitting: Family Medicine

## 2013-07-03 VITALS — BP 225/115 | HR 91 | Temp 99.0°F | Resp 19 | Wt 242.0 lb

## 2013-07-03 DIAGNOSIS — I10 Essential (primary) hypertension: Secondary | ICD-10-CM

## 2013-07-03 DIAGNOSIS — E039 Hypothyroidism, unspecified: Secondary | ICD-10-CM

## 2013-07-03 DIAGNOSIS — E119 Type 2 diabetes mellitus without complications: Secondary | ICD-10-CM

## 2013-07-03 LAB — POCT URINALYSIS DIPSTICK
Glucose, UA: NEGATIVE
Leukocytes, UA: NEGATIVE
Nitrite, UA: NEGATIVE
Protein, UA: 30
Spec Grav, UA: >=1.03
Urobilinogen, UA: 0.2

## 2013-07-03 LAB — COMPREHENSIVE METABOLIC PANEL
AST: 19 U/L (ref 0–37)
CO2: 27 mEq/L (ref 19–32)
Potassium: 4.5 mEq/L (ref 3.5–5.3)
Total Bilirubin: 0.4 mg/dL (ref 0.3–1.2)
Total Protein: 7.1 g/dL (ref 6.0–8.3)

## 2013-07-03 LAB — POCT UA - MICROSCOPIC ONLY: Crystals, Ur, HPF, POC: NEGATIVE

## 2013-07-03 LAB — POCT CBC
Granulocyte percent: 58.8 %G (ref 37–80)
HCT, POC: 46.6 % (ref 37.7–47.9)
Hemoglobin: 15.1 g/dL (ref 12.2–16.2)
Lymph, poc: 3 (ref 0.6–3.4)
MCH, POC: 28.8 pg (ref 27–31.2)
POC Granulocyte: 4.9 (ref 2–6.9)
POC MID %: 5.5 %M (ref 0–12)
Platelet Count, POC: 307 10*3/uL (ref 142–424)
RBC: 5.24 M/uL (ref 4.04–5.48)

## 2013-07-03 LAB — LIPID PANEL
HDL: 40 mg/dL (ref >39)
LDL Cholesterol: 149 mg/dL — ABNORMAL HIGH (ref 0–99)
Total CHOL/HDL Ratio: 6.4 Ratio
VLDL: 67 mg/dL — ABNORMAL HIGH (ref 0–40)

## 2013-07-03 MED ORDER — HYDROCHLOROTHIAZIDE 25 MG PO TABS: 25.0000 mg | ORAL_TABLET | Freq: Every day | ORAL | Status: DC

## 2013-07-03 MED ORDER — METOPROLOL TARTRATE 100 MG PO TABS: 100.0000 mg | ORAL_TABLET | Freq: Two times a day (BID) | ORAL | Status: DC

## 2013-07-03 NOTE — Progress Notes (Signed)
[address]   [place], [place]  [address]   [phone]  Subjective:    Patient ID: Victoria [name], female    DOB: [birth year], 57 y.o.   MRN: [id number]  HPI This 57 y.o. female presents for evaluation of hypertension.  Last visit one year ago.  Diagnosed four years ago; mostly developed with weight.  Home BP readings running 170/92; for past 4-6 weeks, running 170/100.  Avoids added salt; decreased fat content; changing the way of cooking.  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  +HA and dizziness.  No chest pain, palpitations, SOB, leg swelling.  Some chest heaviness sporadically that is not related to exertion.  Pulse runs 60s; no tachycardia.  Previous Lisinopril without much benefit.  No diuretic use in the past.    2.  DDD lumbar, shoulders, knees: followed by pain management in [place].  No  Disability; no formal diagnosis to qualify for disability; does not work due to OA pain.  Takes Tramadol.  3.  DMII: has weaned off of Metformin due to weight loss and improved sugar readings; nocturia x 1;  Has not checked sugar lately.  +tingling in feet intermittently; +leg cramps and spasms a lot lately.  4.  Hyperthyroidism: diagnosed with hyperthyroidism in past few years; s/p radioactive ablation.  TSH last year normal.   PCP:  [facility]  Review of Systems  Constitutional: Negative for fever, chills, diaphoresis and fatigue.  Eyes: Negative for photophobia and visual disturbance.  Respiratory: Negative for shortness of breath, wheezing and stridor.   Cardiovascular: Positive for chest pain. Negative for palpitations and leg swelling.  Gastrointestinal: Negative for nausea, vomiting, abdominal pain and diarrhea.  Endocrine: Negative for cold intolerance, heat intolerance, polydipsia, polyphagia and polyuria.  Skin: Negative for color change, pallor, rash and wound.  Neurological: Positive for dizziness, numbness and headaches. Negative for tremors, seizures, syncope, facial  asymmetry, speech difficulty, weakness and light-headedness.  Psychiatric/Behavioral: Negative for dysphoric mood. The patient is not nervous/anxious.     Past Medical History  Diagnosis Date  . CARDIAC ARRHYTHMIA 01/11/2010  . DEPRESSION 01/11/2010  . DIABETES MELLITUS, TYPE II 01/11/2010  . DYSPNEA 12/30/2010  . GERD 01/11/2010  . GOITER, MULTINODULAR 01/22/2010  . Headache(784.0) 11/30/2010  . HYPERTENSION 01/11/2010  . HYPERTHYROIDISM 01/22/2010  . LEUKOCYTOSIS UNSPECIFIED 01/11/2010  . Morbid obesity 01/11/2010  . SLEEP APNEA 01/11/2010  . TOBACCO ABUSE 01/11/2010  . UTERINE POLYP 01/11/2010  . Allergy   . Arthritis     Past Surgical History  Procedure Laterality Date  . Uterine polyp  2005    Removed  . Cholecystectomy  1992    Prior to Admission medications   Medication Sig Start Date End Date Taking? Authorizing Provider  docusate sodium (COLACE) 50 MG capsule Take by mouth 2 (two) times daily.   Yes Historical Provider, MD  metoprolol (LOPRESSOR) 100 MG tablet Take 1 tablet (100 mg total) by mouth 2 (two) times daily. Needs office visit and labs for further refills 05/08/13  Yes Ryan M Dunn, PA-C  traMADol (ULTRAM) 50 MG tablet Take 200 mg by mouth every 6 (six) hours as needed.    Yes Historical Provider, MD    Allergies  Allergen Reactions  . Droperidol     History   Social History  . Marital Status: Divorced    Spouse Name: N/A    Number of Children: N/A  . Years of Education: N/A   Occupational History  . Not on file.  Social History Main Topics  . Smoking status: Current Every Day Smoker -- 0.50 packs/day for 20 years    Types: Cigarettes  . Smokeless tobacco: Not on file     Comment: Divorced-moved to [place] to live with her mother  . Alcohol Use: No  . Drug Use: No  . Sexual Activity: Not on file   Other Topics Concern  . Not on file   Social History Narrative   Marital status: Single       Children;  2 children; no grandchildren.      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 Emphysema Mother   . COPD Mother   . Diabetes Mother   . Alcohol abuse Other     parent not sure which 1  . Arthritis Other     grandparent not sure which 1  . Colon cancer Other     Parent not sure which 1  . Heart disease Other   . Heart disease Father 70    CABG        Objective:   Physical Exam  Nursing note and vitals reviewed. Constitutional: She is oriented to person, place, and time. She appears well-developed and well-nourished. No distress.  HENT:  Head: Normocephalic and atraumatic.  Right Ear: External ear normal.  Left Ear: External ear normal.  Nose: Nose normal.  Mouth/Throat: Oropharynx is clear and moist.  Eyes: Conjunctivae and EOM are normal. Pupils are equal, round, and reactive to light.  Neck: Normal range of motion. Neck supple. No thyromegaly present.  Cardiovascular: Normal rate, regular rhythm, normal heart sounds and intact distal pulses.  Exam reveals no gallop and no friction rub.   No murmur heard. Pulmonary/Chest: Effort normal and breath sounds normal. She has no wheezes. She has no rales.  Abdominal: Soft. Bowel sounds are normal. She exhibits no distension and no mass. There is tenderness in the epigastric area. There is no rebound and no guarding.  Lymphadenopathy:    She has no cervical adenopathy.  Neurological: She is alert and oriented to person, place, and time. No cranial nerve deficit. She exhibits normal muscle tone. Coordination normal.  Monofilament intact B feet.  Skin: Skin is warm and dry. No rash noted. She is not diaphoretic. No erythema. No pallor.  Psychiatric: She has a normal mood and affect. Her behavior is normal.   Results for orders placed in visit on 07/03/13  POCT CBC       Result Value Range   WBC 8.3  4.6 - 10.2 K/uL   Lymph, poc 3.0  0.6 - 3.4   POC LYMPH PERCENT 35.7  10 - 50 %L   MID (cbc) 0.5  0 - 0.9   POC MID % 5.5  0 - 12 %M   POC Granulocyte 4.9  2 - 6.9   Granulocyte percent 58.8  37 - 80 %G   RBC 5.24  4.04 - 5.48 M/uL   Hemoglobin 15.1  12.2 - 16.2 g/dL   HCT, POC 16.1  09.6 - 47.9 %   MCV 88.9  80 - 97 fL   MCH, POC 28.8  27 - 31.2 pg   MCHC  32.4  31.8 - 35.4 g/dL   RDW, POC 40.9     Platelet Count, POC 307  142 - 424 K/uL   MPV 7.6  0 - 99.8 fL  POCT GLYCOSYLATED HEMOGLOBIN (HGB A1C)      Result Value Range   Hemoglobin A1C 5.3    POCT UA - MICROSCOPIC ONLY      Result Value Range   WBC, Ur, HPF, POC 2-5     RBC, urine, microscopic 3-7     Bacteria, U Microscopic 2+     Mucus, UA positive     Epithelial cells, urine per micros 6-8     Crystals, Ur, HPF, POC neg     Casts, Ur, LPF, POC neg     Yeast, UA neg    POCT URINALYSIS DIPSTICK      Result Value Range   Color, UA amber     Clarity, UA sl cloudy     Glucose, UA neg     Bilirubin, UA neg     Ketones, UA trace     Spec Grav, UA >=1.030     Blood, UA large     pH, UA 5.5     Protein, UA 30     Urobilinogen, UA 0.2     Nitrite, UA neg     Leukocytes, UA Negative         Assessment & Plan:  Accelerated hypertension - Plan: POCT CBC, POCT UA - Microscopic Only, POCT urinalysis dipstick, Comprehensive metabolic panel, Lipid panel, hydrochlorothiazide (HYDRODIURIL) 25 MG tablet, metoprolol (LOPRESSOR) 100 MG tablet  Diabetes - Plan: POCT CBC, POCT glycosylated hemoglobin (Hb A1C), POCT UA - Microscopic Only, POCT urinalysis dipstick, Comprehensive metabolic panel, Lipid panel  Hypothyroidism - Plan: POCT CBC, POCT UA - Microscopic Only, POCT urinalysis dipstick, TSH, Comprehensive metabolic panel   1. HTN: uncontrolled; asymptomatic; rx for HCTZ 25mg  1/2 daily x 1 week and then increase to one tablet daily; refill of Metoprolol provided.  RTC three months for  reevaluation. 2.  DMII: controlled with diet, weight loss.  Congratulations on hard work.  Obtain labs. 3.  Hyperthyroidism: stable; s/p radioactive ablation; repeat labs. 4.  Hematuria:  New.  Asymptomatic; repeat at next visit.  Meds ordered this encounter  Medications  . docusate sodium (COLACE) 50 MG capsule    Sig: Take by mouth 2 (two) times daily.  . hydrochlorothiazide (HYDRODIURIL) 25 MG tablet    Sig: Take 1 tablet (25 mg total) by mouth daily.    Dispense:  90 tablet    Refill:  3  . metoprolol (LOPRESSOR) 100 MG tablet    Sig: Take 1 tablet (100 mg total) by mouth 2 (two) times daily.    Dispense:  180 tablet    Refill:  3

## 2013-07-08 NOTE — Progress Notes (Signed)
Left msg for pt to schedule 3 month f/up with Dr. Katrinka Blazing.

## 2013-07-09 ENCOUNTER — Encounter: Payer: Self-pay | Admitting: Family Medicine

## 2013-07-09 MED ORDER — LOVASTATIN 20 MG PO TABS: 20.0000 mg | ORAL_TABLET | Freq: Every day | ORAL | Status: DC

## 2013-07-09 NOTE — Addendum Note (Signed)
Addended by: Ethelda Chick on: 07/09/2013 02:54 PM   Modules accepted: Orders

## 2013-07-16 NOTE — Progress Notes (Signed)
Sent pt reminder letter to schedule 3 month f-up with Dr. Katrinka Blazing.

## 2013-09-26 ENCOUNTER — Other Ambulatory Visit: Payer: Self-pay

## 2013-12-27 ENCOUNTER — Other Ambulatory Visit: Payer: Self-pay | Admitting: Family Medicine

## 2014-06-30 ENCOUNTER — Ambulatory Visit: Payer: Self-pay

## 2014-06-30 ENCOUNTER — Ambulatory Visit (INDEPENDENT_AMBULATORY_CARE_PROVIDER_SITE_OTHER): Payer: Self-pay | Admitting: Family Medicine

## 2014-06-30 ENCOUNTER — Ambulatory Visit (INDEPENDENT_AMBULATORY_CARE_PROVIDER_SITE_OTHER): Payer: Self-pay

## 2014-06-30 ENCOUNTER — Encounter: Payer: Self-pay | Admitting: Family Medicine

## 2014-06-30 VITALS — BP 134/91 | HR 116 | Temp 99.4°F | Resp 18 | Ht 62.5 in | Wt 235.2 lb

## 2014-06-30 DIAGNOSIS — M5441 Lumbago with sciatica, right side: Secondary | ICD-10-CM

## 2014-06-30 DIAGNOSIS — R319 Hematuria, unspecified: Secondary | ICD-10-CM

## 2014-06-30 DIAGNOSIS — M25551 Pain in right hip: Secondary | ICD-10-CM

## 2014-06-30 DIAGNOSIS — M25511 Pain in right shoulder: Secondary | ICD-10-CM

## 2014-06-30 DIAGNOSIS — M25519 Pain in unspecified shoulder: Secondary | ICD-10-CM

## 2014-06-30 DIAGNOSIS — I10 Essential (primary) hypertension: Secondary | ICD-10-CM

## 2014-06-30 DIAGNOSIS — M25559 Pain in unspecified hip: Secondary | ICD-10-CM

## 2014-06-30 DIAGNOSIS — M5430 Sciatica, unspecified side: Secondary | ICD-10-CM

## 2014-06-30 DIAGNOSIS — E7800 Pure hypercholesterolemia, unspecified: Secondary | ICD-10-CM

## 2014-06-30 LAB — POCT UA - MICROSCOPIC ONLY
CASTS, UR, LPF, POC: NEGATIVE
Crystals, Ur, HPF, POC: NEGATIVE
Mucus, UA: POSITIVE
YEAST UA: NEGATIVE

## 2014-06-30 LAB — POCT URINALYSIS DIPSTICK
BILIRUBIN UA: NEGATIVE
GLUCOSE UA: NEGATIVE
Ketones, UA: NEGATIVE
NITRITE UA: NEGATIVE
PH UA: 5.5
Protein, UA: NEGATIVE
SPEC GRAV UA: 1.015
Urobilinogen, UA: 0.2

## 2014-06-30 MED ORDER — METOPROLOL TARTRATE 100 MG PO TABS: 100.0000 mg | ORAL_TABLET | Freq: Two times a day (BID) | ORAL | Status: DC

## 2014-06-30 MED ORDER — LISINOPRIL 10 MG PO TABS: 10.0000 mg | ORAL_TABLET | Freq: Every day | ORAL | Status: DC

## 2014-06-30 MED ORDER — LOVASTATIN 20 MG PO TABS: 20.0000 mg | ORAL_TABLET | Freq: Every day | ORAL | Status: DC

## 2014-06-30 MED ORDER — HYDROCHLOROTHIAZIDE 25 MG PO TABS: 25.0000 mg | ORAL_TABLET | Freq: Every day | ORAL | Status: DC

## 2014-06-30 MED ORDER — HYDROCODONE-ACETAMINOPHEN 5-325 MG PO TABS: 1.0000 | ORAL_TABLET | Freq: Four times a day (QID) | ORAL | Status: DC | PRN

## 2014-06-30 NOTE — Progress Notes (Addendum)
Subjective:   This chart was scribed for Victoria Honour, MD by [name], ED Scribe. This patient was seen in room 22 and the patient's care was started at 11:32 AM.   Patient ID: Victoria [name], female    DOB: 1956-01-07, 58 y.o.   MRN: [id number]  HPI Chief Complaint  Patient presents with   Medication Refill   Fall    fell friday night in the shower and having pain in her entire body   Hypertension   HPI Comments: Victoria [name] is a 58 y.o. female who presents to the [facility] for a 1 year f/u  BP medication refill. Pt was seen 1 year ago for HTN, degenerative disc disease, shoulder and knee pain. Pt has been on tramadol for chronic pain. She was on metformin for DMII in the past in which she tapered off. 5.3- A1C last year. Large amount of blood in urine last visit and was to repeat next visit. Pt had elevated cholesterol and was taking lovastatin 20 mg daily. Pt has ran out of lovastatin but has been taking metoprolol and HTCZ. Pt reports he BP has been 160/100. Pt does not check BP regular and only checks when she is feeling down. Pt has taken lisinopril in the past without any effect to BP.  Pt has gone from smoking a pack daily to 2 cigarettes a day. She reports she is also working on weight loss.  Pt is also c/o a fall that occured 3 days.  She reports was cleaning her feet while in a hotel, holding the grip bar, which broke off the wall causing her to fall. Pt states she fell on entire right side. She now has right shoulder pain and right hip pain. Pt has been taking advil 2 tablet every 2- 4 hours for the past 2 days without relief to pain. She states the day after the fall she had topical buttock numbness with pressure. She now has radiating aching right hip pain and throbbing right shoulder pain. Pt states hip pain radiates down entire right leg with associated numbness but is able to feel pressure.  She reports she hit the back of her head without  bleeding. She denies bleeding to this area. She reports mild nausea after fall without emesis. She denies LOC. Pt denies CP and SOB. Pt denies bladder and bowel incontinence.   Past Medical History  Diagnosis Date   CARDIAC ARRHYTHMIA 01/11/2010   DEPRESSION 01/11/2010   DIABETES MELLITUS, TYPE II 01/11/2010   DYSPNEA 12/30/2010   GERD 01/11/2010   GOITER, MULTINODULAR 01/22/2010   Headache(784.0) 11/30/2010   HYPERTENSION 01/11/2010   HYPERTHYROIDISM 01/22/2010   LEUKOCYTOSIS UNSPECIFIED 01/11/2010   Morbid obesity 01/11/2010   SLEEP APNEA 01/11/2010   TOBACCO ABUSE 01/11/2010   UTERINE POLYP 01/11/2010   Allergy    Arthritis    Past Surgical History  Procedure Laterality Date   Uterine polyp  2005    Removed   Cholecystectomy  1992   Prior to Admission medications   Medication Sig Start Date End Date Taking? Authorizing Provider  docusate sodium (COLACE) 50 MG capsule Take by mouth 2 (two) times daily.    Historical Provider, MD  hydrochlorothiazide (HYDRODIURIL) 25 MG tablet Take 1 tablet (25 mg total) by mouth daily. 07/03/13   Victoria Honour, MD  lovastatin (MEVACOR) 20 MG tablet Take 1 tablet (20 mg total) by mouth daily. PATIENT NEEDS OFFICE VISIT FOR ADDITIONAL REFILLS 12/27/13   Steffanie Dunn  Koren Bound, MD  metoprolol (LOPRESSOR) 100 MG tablet Take 1 tablet (100 mg total) by mouth 2 (two) times daily. 07/03/13   Victoria Honour, MD  traMADol (ULTRAM) 50 MG tablet Take 200 mg by mouth every 6 (six) hours as needed.     Historical Provider, MD   History   Social History   Marital Status: Divorced    Spouse Name: N/A    Number of Children: N/A   Years of Education: N/A   Occupational History   Not on file.   Social History Main Topics   Smoking status: Current Every Day Smoker -- 0.50 packs/day for 20 years    Types: Cigarettes   Smokeless tobacco: Not on file     Comment: Divorced-moved to [place] to live with her mother   Alcohol Use: No   Drug Use: No   Sexual  Activity: Not on file   Other Topics Concern   Not on file   Social History Narrative   Marital status: Single       Children;  2 children; no grandchildren.      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  Eyes: Negative for visual disturbance.  Respiratory: Negative for cough and shortness of breath.   Cardiovascular: Negative for chest pain, palpitations and leg swelling.  Gastrointestinal: Positive for nausea. Negative for vomiting, abdominal pain, diarrhea, constipation and blood in stool.  Endocrine: Negative for cold intolerance, heat intolerance, polydipsia, polyphagia and polyuria.  Musculoskeletal: Positive for arthralgias, back pain and myalgias. Negative for gait problem, neck pain and neck stiffness.  Neurological: Positive for numbness. Negative for dizziness, tremors, seizures, syncope, facial asymmetry, speech difficulty, weakness, light-headedness and headaches.  Psychiatric/Behavioral: Negative for confusion and decreased concentration.   Objective:   Physical Exam  Nursing note and vitals reviewed. Constitutional: She is oriented to person, place, and time. She appears well-developed and well-nourished. No distress.  HENT:  Head: Normocephalic and atraumatic.  Right Ear: External ear normal.  Left Ear: External ear normal.  Nose: Nose normal.  Mouth/Throat: Oropharynx is clear and moist.   Eyes: Conjunctivae and EOM are normal. Pupils are equal, round, and reactive to light.  Neck: Normal range of motion. Neck supple. Carotid bruit is not present. No thyromegaly present.  Cardiovascular: Normal rate, regular rhythm, normal heart sounds and intact distal pulses.  Exam reveals no gallop and no friction rub.   No murmur heard. Pulmonary/Chest: Effort normal and breath sounds normal. She has no wheezes. She has no rales.  Abdominal: Soft. Bowel sounds are normal. She exhibits no distension and no mass. There is no tenderness. There is no rebound and no guarding.  Musculoskeletal:  Right shoulder: She exhibits decreased range of motion, tenderness and pain. She exhibits no bony tenderness, no spasm and normal strength.       Right hip: She exhibits decreased range of motion, decreased strength, tenderness and bony tenderness.       Cervical back: She exhibits decreased range of motion, tenderness, pain and spasm. She exhibits no bony tenderness.       Thoracic back: Normal. She exhibits normal range of motion, no tenderness, no bony tenderness, no pain and no spasm.       Lumbar back: She exhibits decreased range of motion. She exhibits no tenderness and no bony tenderness.  Right trapezius tenderness.. No midline spine tenderness.  TTP great trochanter right hip.TTP right lower lumbar.   Lymphadenopathy:    She has no cervical adenopathy.  Neurological: She is alert and oriented to person, place, and time. No cranial nerve deficit.  Skin: Skin is warm and dry. No rash noted. She is not diaphoretic. No erythema. No pallor.  Psychiatric: She has a normal mood and affect. Her behavior is normal.   [facility] reading (PRIMARY) by  Dr. Tamala Julian.  R SHOULDER: NAD.  R HIP: NAD; LS SPINE FILMS:  Degenerative changes multiple levels; NAD.  Results for orders placed in visit on 06/30/14  COMPLETE METABOLIC PANEL WITH GFR      Result Value Ref Range   Sodium 136  135 - 145 mEq/L   Potassium  3.7  3.5 - 5.3 mEq/L   Chloride 100  96 - 112 mEq/L   CO2 26  19 - 32 mEq/L   Glucose, Bld 113 (*) 70 - 99 mg/dL   BUN 16  6 - 23 mg/dL   Creat 0.61  0.50 - 1.10 mg/dL   Total Bilirubin 0.3  0.2 - 1.2 mg/dL   Alkaline Phosphatase 63  39 - 117 U/L   AST 22  0 - 37 U/L   ALT 14  0 - 35 U/L   Total Protein 6.6  6.0 - 8.3 g/dL   Albumin 4.1  3.5 - 5.2 g/dL   Calcium 9.2  8.4 - 10.5 mg/dL   GFR, Est African American >89     GFR, Est Non African American >89    POCT UA - MICROSCOPIC ONLY      Result Value Ref Range   WBC, Ur, HPF, POC 3-4     RBC, urine, microscopic 5-6     Bacteria, U Microscopic 1+     Mucus, UA pos     Epithelial cells, urine per micros 2-3     Crystals, Ur, HPF, POC neg     Casts, Ur, LPF, POC neg     Yeast, UA neg    POCT URINALYSIS DIPSTICK      Result Value Ref Range   Color, UA yellow     Clarity, UA clear     Glucose, UA neg     Bilirubin, UA neg     Ketones, UA neg     Spec Grav, UA 1.015     Blood, UA mod     pH, UA 5.5     Protein, UA neg     Urobilinogen, UA 0.2     Nitrite, UA neg     Leukocytes, UA Trace       Assessment & Plan:  Accelerated hypertension - Plan: lisinopril (PRINIVIL,ZESTRIL) 10 MG tablet, metoprolol (LOPRESSOR) 100 MG tablet, hydrochlorothiazide (HYDRODIURIL) 25 MG tablet, POCT UA - Microscopic Only, POCT urinalysis  dipstick, COMPLETE METABOLIC PANEL WITH GFR  Pure hypercholesterolemia - Plan: lovastatin (MEVACOR) 20 MG tablet, COMPLETE METABOLIC PANEL WITH GFR  Hematuria, unspecified - Plan: POCT UA - Microscopic Only, POCT urinalysis dipstick, Ambulatory referral to Urology  Right hip pain - Plan: DG Lumbar Spine 2-3 Views, DG Hip Complete Right, HYDROcodone-acetaminophen (NORCO/VICODIN) 5-325 MG per tablet  Right shoulder pain - Plan: DG Lumbar Spine 2-3 Views, DG Shoulder Right, HYDROcodone-acetaminophen (NORCO/VICODIN) 5-325 MG per tablet  Right-sided low back pain with right-sided sciatica - Plan: DG Lumbar Spine  2-3 Views, HYDROcodone-acetaminophen (NORCO/VICODIN) 5-325 MG per tablet  1. HTN: uncontrolled; ADD Lisinopril 10mg  daily. Obtain labs, u/a. 2. Hypercholesterolemia: uncontrolled due to non-compliance with medication; refill provided.   3.  Hematuria microscopic:  New. In a smoker; persistent today; refer to urology. 4.  DDD lumbar spine: chronic issue; followed by pain management; recommend establishing with local orthopedist as well. 5.  R hip pain/contusion:  New. Secondary to fall.  Recommend rest, ice, Hydrocodone. 6. R shoulder pain/contusion:  New. Secondary to fall. Recommend rest, icing bid; rx for hydrocodone provided.  Meds ordered this encounter  Medications   glucosamine-chondroitin 500-400 MG tablet    Sig: Take 1 tablet by mouth 3 (three) times daily.   ibuprofen (ADVIL,MOTRIN) 200 MG tablet    Sig: Take 200 mg by mouth every 6 (six) hours as needed.   lisinopril (PRINIVIL,ZESTRIL) 10 MG tablet    Sig: Take 1 tablet (10 mg total) by mouth daily.    Dispense:  90 tablet    Refill:  3   lovastatin (MEVACOR) 20 MG tablet    Sig: Take 1 tablet (20 mg total) by mouth daily.    Dispense:  90 tablet    Refill:  1   metoprolol (LOPRESSOR) 100 MG tablet    Sig: Take 1 tablet (100 mg total) by mouth 2 (two) times daily.    Dispense:  180 tablet    Refill:  1   hydrochlorothiazide (HYDRODIURIL) 25 MG tablet    Sig: Take 1 tablet (25 mg total) by mouth daily.    Dispense:  90 tablet    Refill:  1   HYDROcodone-acetaminophen (NORCO/VICODIN) 5-325 MG per tablet    Sig: Take 1 tablet by mouth every 6 (six) hours as needed for moderate pain.    Dispense:  30 tablet    Refill:  0    I personally performed the services described in this documentation, which was scribed in my presence.  The recorded information has been reviewed and is accurate.  Reginia Forts, M.D.  [facility] [address] [place], [place]  [address] [phone]  phone [phone] fax

## 2014-07-01 LAB — COMPLETE METABOLIC PANEL WITH GFR
ALBUMIN: 4.1 g/dL (ref 3.5–5.2)
ALT: 14 U/L (ref 0–35)
AST: 22 U/L (ref 0–37)
Alkaline Phosphatase: 63 U/L (ref 39–117)
BUN: 16 mg/dL (ref 6–23)
CALCIUM: 9.2 mg/dL (ref 8.4–10.5)
CHLORIDE: 100 meq/L (ref 96–112)
CO2: 26 mEq/L (ref 19–32)
CREATININE: 0.61 mg/dL (ref 0.50–1.10)
GLUCOSE: 113 mg/dL — AB (ref 70–99)
POTASSIUM: 3.7 meq/L (ref 3.5–5.3)
Sodium: 136 mEq/L (ref 135–145)
Total Bilirubin: 0.3 mg/dL (ref 0.2–1.2)
Total Protein: 6.6 g/dL (ref 6.0–8.3)

## 2014-07-16 ENCOUNTER — Encounter: Payer: Self-pay | Admitting: Family Medicine

## 2014-07-16 DIAGNOSIS — G8929 Other chronic pain: Secondary | ICD-10-CM

## 2014-07-16 DIAGNOSIS — M545 Low back pain: Principal | ICD-10-CM

## 2014-11-20 ENCOUNTER — Telehealth: Payer: Self-pay

## 2014-11-20 DIAGNOSIS — I10 Essential (primary) hypertension: Secondary | ICD-10-CM

## 2014-11-20 MED ORDER — METOPROLOL TARTRATE 100 MG PO TABS: 100.0000 mg | ORAL_TABLET | Freq: Two times a day (BID) | ORAL | Status: DC

## 2014-11-20 NOTE — Telephone Encounter (Signed)
[facility] in [place] called and reported pt is in [place] and needs a RF of her metoprolol. Gave OK for 1 mos RF only bc pt is then due for f/up.

## 2014-12-03 ENCOUNTER — Telehealth: Payer: Self-pay

## 2014-12-03 DIAGNOSIS — I10 Essential (primary) hypertension: Secondary | ICD-10-CM

## 2014-12-03 MED ORDER — METOPROLOL TARTRATE 100 MG PO TABS: 100.0000 mg | ORAL_TABLET | Freq: Two times a day (BID) | ORAL | Status: DC

## 2014-12-03 NOTE — Telephone Encounter (Signed)
Pt LM on VM asking for 7 more days of metoprolol be sent to [facility] in [place] ([place] to last her until appt 12/10/14. Sent RF and notified pt.

## 2014-12-03 NOTE — Telephone Encounter (Signed)
Pt says that when [facility] transferred her rx from [place] to [place] they only gave her #30. Authorized another #30 to finish out the month until she could get back in town and recheck.

## 2014-12-10 ENCOUNTER — Encounter: Payer: Self-pay | Admitting: Family Medicine

## 2014-12-10 ENCOUNTER — Ambulatory Visit (INDEPENDENT_AMBULATORY_CARE_PROVIDER_SITE_OTHER): Payer: 59 | Admitting: Family Medicine

## 2014-12-10 VITALS — BP 120/84 | HR 70 | Temp 99.2°F | Resp 16 | Ht 64.0 in | Wt 234.0 lb

## 2014-12-10 DIAGNOSIS — R319 Hematuria, unspecified: Secondary | ICD-10-CM

## 2014-12-10 DIAGNOSIS — E785 Hyperlipidemia, unspecified: Secondary | ICD-10-CM | POA: Insufficient documentation

## 2014-12-10 DIAGNOSIS — K439 Ventral hernia without obstruction or gangrene: Secondary | ICD-10-CM

## 2014-12-10 DIAGNOSIS — I10 Essential (primary) hypertension: Secondary | ICD-10-CM

## 2014-12-10 DIAGNOSIS — R7302 Impaired glucose tolerance (oral): Secondary | ICD-10-CM

## 2014-12-10 DIAGNOSIS — M5136 Other intervertebral disc degeneration, lumbar region: Secondary | ICD-10-CM

## 2014-12-10 LAB — COMPLETE METABOLIC PANEL WITH GFR
ALK PHOS: 60 U/L (ref 39–117)
ALT: 11 U/L (ref 0–35)
AST: 17 U/L (ref 0–37)
Albumin: 3.7 g/dL (ref 3.5–5.2)
BILIRUBIN TOTAL: 0.3 mg/dL (ref 0.2–1.2)
BUN: 15 mg/dL (ref 6–23)
CO2: 30 mEq/L (ref 19–32)
CREATININE: 0.55 mg/dL (ref 0.50–1.10)
Calcium: 9.5 mg/dL (ref 8.4–10.5)
Chloride: 102 mEq/L (ref 96–112)
GFR, Est African American: >89 mL/min
GFR, Est Non African American: >89 mL/min
Glucose, Bld: 93 mg/dL (ref 70–99)
Potassium: 4.1 mEq/L (ref 3.5–5.3)
Sodium: 139 mEq/L (ref 135–145)
Total Protein: 6.6 g/dL (ref 6.0–8.3)

## 2014-12-10 LAB — LIPID PANEL
CHOL/HDL RATIO: 5 ratio
Cholesterol: 207 mg/dL — ABNORMAL HIGH (ref 0–200)
HDL: 41 mg/dL (ref >39)
Triglycerides: 496 mg/dL — ABNORMAL HIGH (ref <150)

## 2014-12-10 MED ORDER — HYDROCHLOROTHIAZIDE 25 MG PO TABS: 25.0000 mg | ORAL_TABLET | Freq: Every day | ORAL | Status: DC

## 2014-12-10 MED ORDER — LOVASTATIN 20 MG PO TABS: 20.0000 mg | ORAL_TABLET | Freq: Every day | ORAL | Status: DC

## 2014-12-10 MED ORDER — METOPROLOL TARTRATE 100 MG PO TABS: 100.0000 mg | ORAL_TABLET | Freq: Two times a day (BID) | ORAL | Status: DC

## 2014-12-10 NOTE — Progress Notes (Signed)
Subjective:    Patient ID: Victoria [name], female    DOB: [birth year], 59 y.o.   MRN: [id number]  HPI  This is a 59 year old female with PMH HTN, HLD, DDD, DMII and anxiety who is presenting for follow up. Last seen 6 months ago.  HTN: Last visit lisinopril was added to metoprolol and HCTZ. She has a monitor at home but does not check regularly. She states since last visit she has "checked a handful of times" and doesn't usually check unless she is feeling bad. The few times she has checked it has been around 150/90.  DMII: has been on metformin in the past but was able to come off d/t weight loss and diet. Last A1C 1.5 years ago was 5.3.  Tobacco abuse: down to 2 cigarettes a day. Smoked 2 ppd 3 years ago.  HLD: taking lovastatin 20 mg. Is not fasting today.   DDD: She is seen by a pain clinic. They give her tramadol, klonopin and flexeril. At last visit after a fall that resulted in back pain and sciatica she was referred to a spinal surgeon - told nothing surgical. She was referred from ortho to PT which she states helped. No more sciatica. She has also had some numbness in her bilateral fingers which PT thinks is thoracic outlet syndrome. When she was last here she was self pay. She needs a referral back to PT.   Anxiety: She takes klonopin QHS to help her sleep. She states her mind races at night. She reports her pain provider started her on brintellix yesterday for anxiety and told her it may help her sleep as well and may be able to stop klonopin.  Hematuria: At last visit was referred to urology, Dr. Louis Meckel. She has an appt next week but is unsure whether new insurance will cover this and wants a new referral.  Abdominal hernia: Has had an abdominal hernia for 24 years. It is becoming more symptomatic. She is having more nausea, gas and bloating. Eat large meals bothers her. She has more pain with standing. She is wanting to be referred for possible surgical repair.  Review of  Systems  Constitutional: Negative for fever and chills.  HENT: Negative for sore throat.   Eyes: Negative for visual disturbance.  Gastrointestinal: Positive for nausea and abdominal pain. Negative for vomiting, diarrhea and constipation.  Genitourinary: Negative for dysuria.  Musculoskeletal: Positive for arthralgias.  Skin: Negative for rash.  Psychiatric/Behavioral: Positive for sleep disturbance. The patient is nervous/anxious.    Patient Active Problem List   Diagnosis Date Noted  . Hyperlipidemia 12/10/2014  . DYSPNEA 12/30/2010  . HEADACHE 11/30/2010  . GOITER, MULTINODULAR 01/22/2010  . HYPERTHYROIDISM 01/22/2010  . DIABETES MELLITUS, TYPE II 01/11/2010  . MORBID OBESITY 01/11/2010  . TOBACCO ABUSE 01/11/2010  . DEPRESSION 01/11/2010  . HYPERTENSION 01/11/2010  . CARDIAC ARRHYTHMIA 01/11/2010  . GERD 01/11/2010  . UTERINE POLYP 01/11/2010  . SLEEP APNEA 01/11/2010    Prior to Admission medications   Medication Sig Start Date End Date Taking? Authorizing Provider  clonazePAM (KLONOPIN) 1 MG tablet Take 1 mg by mouth at bedtime.   Yes Historical Provider, MD  cyclobenzaprine (FLEXERIL) 5 MG tablet Take 5 mg by mouth 3 (three) times daily - between meals and at bedtime.   Yes Historical Provider, MD  docusate sodium (COLACE) 50 MG capsule Take by mouth 2 (two) times daily.   Yes Historical Provider, MD  glucosamine-chondroitin 500-400 MG tablet Take 1 tablet  by mouth 3 (three) times daily.   Yes Historical Provider, MD  hydrochlorothiazide (HYDRODIURIL) 25 MG tablet Take 1 tablet (25 mg total) by mouth daily. 12/10/14  Yes Bennett Scrape V, PA-C  ibuprofen (ADVIL,MOTRIN) 200 MG tablet Take 200 mg by mouth every 6 (six) hours as needed.   Yes Historical Provider, MD  lisinopril (PRINIVIL,ZESTRIL) 10 MG tablet Take 1 tablet (10 mg total) by mouth daily. 06/30/14  Yes [name], MD  lovastatin (MEVACOR) 20 MG tablet Take 1 tablet (20 mg total) by mouth daily. 12/10/14  Yes  Bennett Scrape V, PA-C  metoprolol (LOPRESSOR) 100 MG tablet Take 1 tablet (100 mg total) by mouth 2 (two) times daily. 12/10/14  Yes Bennett Scrape V, PA-C  PRESCRIPTION MEDICATION Brintellix 10 mg  1 tablet daily   Yes Historical Provider, MD  traMADol (ULTRAM) 50 MG tablet Take 200 mg by mouth every 6 (six) hours as needed.    Yes Historical Provider, MD   Allergies  Allergen Reactions  . Droperidol    Patient's social and family history were reviewed.     Objective:   Physical Exam  Constitutional: She is oriented to person, place, and time. She appears well-developed and well-nourished. No distress.  HENT:  Head: Normocephalic and atraumatic.  Right Ear: Hearing normal.  Left Ear: Hearing normal.  Nose: Nose normal.  Mouth/Throat: Uvula is midline, oropharynx is clear and moist and mucous membranes are normal.  Eyes: Conjunctivae and lids are normal. Right eye exhibits no discharge. Left eye exhibits no discharge. No scleral icterus.  Neck: Carotid bruit is not present.  Cardiovascular: Normal rate, regular rhythm, normal heart sounds, intact distal pulses and normal pulses.   No murmur heard. Pulmonary/Chest: Effort normal and breath sounds normal. No respiratory distress. She has no wheezes. She has no rhonchi. She has no rales.  Abdominal: Soft. Bowel sounds are normal. There is generalized tenderness. A hernia is present. Hernia confirmed positive in the ventral area.  Musculoskeletal: Normal range of motion.  Lymphadenopathy:    She has no cervical adenopathy.  Neurological: She is alert and oriented to person, place, and time.  Skin: Skin is warm, dry and intact. No lesion and no rash noted.  Psychiatric: She has a normal mood and affect. Her speech is normal and behavior is normal. Thought content normal.   BP 120/84 mmHg  Pulse 70  Temp(Src) 99.2 F (37.3 C) (Oral)  Resp 16  Ht 5\' 4"  (1.626 m)  Wt 234 lb (106.142 kg)  BMI 40.15 kg/m2  SpO2 97%      Assessment &  Plan:  1. Essential hypertension, benign Much improved with adding lisinopril. Meds refilled. Encouraged her to check her BP at home at least once a week. Follow up 6 months. - CBC with Differential - COMPLETE METABOLIC PANEL WITH GFR - metoprolol (LOPRESSOR) 100 MG tablet; Take 1 tablet (100 mg total) by mouth 2 (two) times daily.  Dispense: 180 tablet; Refill: 1 - hydrochlorothiazide (HYDRODIURIL) 25 MG tablet; Take 1 tablet (25 mg total) by mouth daily.  Dispense: 90 tablet; Refill: 1  2. Hyperlipidemia Lovastatin refilled. Follow up 6 months. - Lipid panel - lovastatin (MEVACOR) 20 MG tablet; Take 1 tablet (20 mg total) by mouth daily.  Dispense: 90 tablet; Refill: 1  3. Glucose intolerance (impaired glucose tolerance) Last A1C 1.5 years ago 5.3.  - COMPLETE METABOLIC PANEL WITH GFR - Hemoglobin A1c  4. Hematuria - Ambulatory referral to Urology  5. Ventral hernia without  obstruction or gangrene - Ambulatory referral to General Surgery  6. DDD Follow up with pain clinic. Pt will call ortho clinic to get referral back to PT.  Benjaman Pott Drenda Freeze, MHS [facility]  12/10/2014

## 2014-12-10 NOTE — Patient Instructions (Signed)
1. CALL IF NO IMPROVEMENT IN ABDOMINAL SYMPTOMS IN 3-4 WEEKS.   Hypertension Hypertension, commonly called high blood pressure, is when the force of blood pumping through your arteries is too strong. Your arteries are the blood vessels that carry blood from your heart throughout your body. A blood pressure reading consists of a higher number over a lower number, such as 110/72. The higher number (systolic) is the pressure inside your arteries when your heart pumps. The lower number (diastolic) is the pressure inside your arteries when your heart relaxes. Ideally you want your blood pressure below 120/80. Hypertension forces your heart to work harder to pump blood. Your arteries may become narrow or stiff. Having hypertension puts you at risk for heart disease, stroke, and other problems.  RISK FACTORS Some risk factors for high blood pressure are controllable. Others are not.  Risk factors you cannot control include:   Race. You may be at higher risk if you are African American.  Age. Risk increases with age.  Gender. Men are at higher risk than women before age 54 years. After age 40, women are at higher risk than men. Risk factors you can control include:  Not getting enough exercise or physical activity.  Being overweight.  Getting too much fat, sugar, calories, or salt in your diet.  Drinking too much alcohol. SIGNS AND SYMPTOMS Hypertension does not usually cause signs or symptoms. Extremely high blood pressure (hypertensive crisis) may cause headache, anxiety, shortness of breath, and nosebleed. DIAGNOSIS  To check if you have hypertension, your health care provider will measure your blood pressure while you are seated, with your arm held at the level of your heart. It should be measured at least twice using the same arm. Certain conditions can cause a difference in blood pressure between your right and left arms. A blood pressure reading that is higher than normal on one occasion  does not mean that you need treatment. If one blood pressure reading is high, ask your health care provider about having it checked again. TREATMENT  Treating high blood pressure includes making lifestyle changes and possibly taking medicine. Living a healthy lifestyle can help lower high blood pressure. You may need to change some of your habits. Lifestyle changes may include:  Following the DASH diet. This diet is high in fruits, vegetables, and whole grains. It is low in salt, red meat, and added sugars.  Getting at least 2 hours of brisk physical activity every week.  Losing weight if necessary.  Not smoking.  Limiting alcoholic beverages.  Learning ways to reduce stress. If lifestyle changes are not enough to get your blood pressure under control, your health care provider may prescribe medicine. You may need to take more than one. Work closely with your health care provider to understand the risks and benefits. HOME CARE INSTRUCTIONS  Have your blood pressure rechecked as directed by your health care provider.   Take medicines only as directed by your health care provider. Follow the directions carefully. Blood pressure medicines must be taken as prescribed. The medicine does not work as well when you skip doses. Skipping doses also puts you at risk for problems.   Do not smoke.   Monitor your blood pressure at home as directed by your health care provider. SEEK MEDICAL CARE IF:   You think you are having a reaction to medicines taken.  You have recurrent headaches or feel dizzy.  You have swelling in your ankles.  You have trouble with your vision.  SEEK IMMEDIATE MEDICAL CARE IF:  You develop a severe headache or confusion.  You have unusual weakness, numbness, or feel faint.  You have severe chest or abdominal pain.  You vomit repeatedly.  You have trouble breathing. MAKE SURE YOU:   Understand these instructions.  Will watch your condition.  Will get  help right away if you are not doing well or get worse. Document Released: 11/07/2005 Document Revised: 03/24/2014 Document Reviewed: 08/30/2013 [facility] Patient Information 2015 [place], [place]. This information is not intended to replace advice given to you by your health care provider. Make sure you discuss any questions you have with your health care provider.

## 2014-12-11 ENCOUNTER — Telehealth: Payer: Self-pay

## 2014-12-11 ENCOUNTER — Other Ambulatory Visit: Payer: Self-pay | Admitting: Family Medicine

## 2014-12-11 DIAGNOSIS — E785 Hyperlipidemia, unspecified: Secondary | ICD-10-CM

## 2014-12-11 DIAGNOSIS — I10 Essential (primary) hypertension: Secondary | ICD-10-CM

## 2014-12-11 LAB — CBC WITH DIFFERENTIAL/PLATELET
BASOS ABS: 0 10*3/uL (ref 0.0–0.1)
BASOS PCT: 0 % (ref 0–1)
EOS ABS: 0.2 10*3/uL (ref 0.0–0.7)
EOS PCT: 2 % (ref 0–5)
HCT: 38.9 % (ref 36.0–46.0)
Hemoglobin: 13.1 g/dL (ref 12.0–15.0)
Lymphocytes Relative: 31 % (ref 12–46)
Lymphs Abs: 2.9 10*3/uL (ref 0.7–4.0)
MCH: 28.1 pg (ref 26.0–34.0)
MCHC: 33.7 g/dL (ref 30.0–36.0)
MCV: 83.3 fL (ref 78.0–100.0)
MPV: 8.5 fL — AB (ref 8.6–12.4)
Monocytes Absolute: 0.6 10*3/uL (ref 0.1–1.0)
Monocytes Relative: 6 % (ref 3–12)
Neutro Abs: 5.8 10*3/uL (ref 1.7–7.7)
Neutrophils Relative %: 61 % (ref 43–77)
PLATELETS: 302 10*3/uL (ref 150–400)
RBC: 4.67 MIL/uL (ref 3.87–5.11)
RDW: 13.6 % (ref 11.5–15.5)
WBC: 9.5 10*3/uL (ref 4.0–10.5)

## 2014-12-11 LAB — HEMOGLOBIN A1C
Hgb A1c MFr Bld: 6.2 % — ABNORMAL HIGH (ref <5.7)
Mean Plasma Glucose: 131 mg/dL — ABNORMAL HIGH (ref <117)

## 2014-12-11 MED ORDER — LISINOPRIL 10 MG PO TABS: 10.0000 mg | ORAL_TABLET | Freq: Every day | ORAL | Status: DC

## 2014-12-11 MED ORDER — METOPROLOL TARTRATE 100 MG PO TABS: 100.0000 mg | ORAL_TABLET | Freq: Two times a day (BID) | ORAL | Status: DC

## 2014-12-11 MED ORDER — LOVASTATIN 20 MG PO TABS: 20.0000 mg | ORAL_TABLET | Freq: Every day | ORAL | Status: DC

## 2014-12-11 MED ORDER — HYDROCHLOROTHIAZIDE 25 MG PO TABS: 25.0000 mg | ORAL_TABLET | Freq: Every day | ORAL | Status: DC

## 2014-12-11 NOTE — Telephone Encounter (Signed)
Resent Rxs to [employer]. Notified pt and took the [employer] out of demographics.

## 2014-12-11 NOTE — Telephone Encounter (Signed)
Patients medications were sent to [facility]  On 12/10/14 - IN [place]  metoprolol (LOPRESSOR) 100 MG tablet lovastatin (MEVACOR) 20 MG tablet hydrochlorothiazide (HYDRODIURIL) 25 MG tablet lisinopril (PRINIVIL,ZESTRIL) 10 MG tablet  She needs them sent to the [address] location   [phone] (H) [phone]

## 2014-12-11 NOTE — Progress Notes (Signed)
History and physical examinations obtained with Bennett Scrape, PA-C.  Agree with assessment and plan.  Additionally, Abdominal: +BSNA; +large midline ventral hernia non-tender, reducible.  +TTP epigastric region and LLQ.  CV: RRR; no m/g/r.  Lungs: CTA B.  Ext: 1+ edema; compression stockings in place.  Musculoskeletal: full ROM lumbar spine with mild pain. A/P: HTN: controlled; obtain labs; continue current medications.  Hyperlipidemia: stable; obtain labs; refill provided.  Hematuria: scheduled to see urology in upcoming month; will place referral due to recently obtaining insurance.  Large ventral hernia: persistent; now with daily abdominal pain and excessive gas for four weeks; recommend BRAT diet, hydration. If no improvement in pain in upcoming month or if pain worsens, will refer to general surgery; pt comfortable observing symptoms for next few weeks.  Glucose intolerance; obtain labs.  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 Elson Clan; restart PT. Norwood Levo, M.D. [facility] [address] [place], [place]  [address] [phone] phone [phone] fax

## 2015-05-01 ENCOUNTER — Encounter: Payer: Self-pay | Admitting: *Deleted

## 2015-06-08 ENCOUNTER — Ambulatory Visit (INDEPENDENT_AMBULATORY_CARE_PROVIDER_SITE_OTHER): Payer: 59 | Admitting: Family Medicine

## 2015-06-08 ENCOUNTER — Encounter: Payer: Self-pay | Admitting: Family Medicine

## 2015-06-08 VITALS — BP 109/73 | HR 85 | Temp 97.9°F | Resp 16 | Wt 247.0 lb

## 2015-06-08 DIAGNOSIS — R911 Solitary pulmonary nodule: Secondary | ICD-10-CM

## 2015-06-08 DIAGNOSIS — M5136 Other intervertebral disc degeneration, lumbar region: Secondary | ICD-10-CM | POA: Diagnosis not present

## 2015-06-08 DIAGNOSIS — E7800 Pure hypercholesterolemia, unspecified: Secondary | ICD-10-CM

## 2015-06-08 DIAGNOSIS — K439 Ventral hernia without obstruction or gangrene: Secondary | ICD-10-CM

## 2015-06-08 DIAGNOSIS — I10 Essential (primary) hypertension: Secondary | ICD-10-CM | POA: Diagnosis not present

## 2015-06-08 DIAGNOSIS — K760 Fatty (change of) liver, not elsewhere classified: Secondary | ICD-10-CM | POA: Diagnosis not present

## 2015-06-08 DIAGNOSIS — R7302 Impaired glucose tolerance (oral): Secondary | ICD-10-CM

## 2015-06-08 DIAGNOSIS — R319 Hematuria, unspecified: Secondary | ICD-10-CM

## 2015-06-08 MED ORDER — CYCLOBENZAPRINE HCL 5 MG PO TABS: 5.0000 mg | ORAL_TABLET | Freq: Four times a day (QID) | ORAL | Status: DC | PRN

## 2015-06-08 NOTE — Progress Notes (Signed)
Subjective:    Patient ID: Victoria [name], female    DOB: [birth year], 59 y.o.   MRN: [id number]  06/08/2015  Back Pain and follow up after CT   HPI This 59 y.o. female presents for follow-up visit for the following:  1.  Lower back pain: s/p ortho consultation by Dimonski; s/p scans; rx for Prednisone, Flexeril; s/p PT.  Symptoms improved.  Has home exercises to perform. Three weeks ago, ran out of Flexeril generic.  No longer a patient of his due to no surgical intervention.  Has insurance now; requesting Flexeril rx.  No injections recommended by Dimonski; knows that Dr. Mina Marble performs injection.  Feels like R lower spasm.  Not pain but just won't release.  Then gets radicular pain down R leg.  Both legs will do it.  Will radiate into R arm and neck. Very muscular in nature.  Pain management medications does not touch drawing sensation.    2.  Hematuria: s/p cystoscopy that was WNL.  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  Dr. Herrick/urology.  U/a negative at urology appointment.  +tobacco hx.  3.  RML pulmonary nodule: 62mm; quit smoking one year ago.   4. Fatty infiltration liver:  No previous alcohol intake.  Sister with fatty infiltration of liver; LFTs are elevated.  Sister is an excellent cook.  Has gained weight since stopping smoking.    5.  HTN: home blood pressure is running 120/80s. Patient reports good compliance with medication, good tolerance to medication, and good symptom control.     6.  Hyperlipidemia: Patient reports good compliance with medication, good tolerance to medication, and good symptom control.    7.  Tobacco abuse: quit smoking one year ago.    Review of Systems  Constitutional: Negative for fever, chills, diaphoresis and fatigue.  Eyes: Negative for visual disturbance.  Respiratory: Negative for cough and shortness of breath.   Cardiovascular: Negative for chest pain, palpitations and leg swelling.  Gastrointestinal: Negative for  nausea, vomiting, abdominal pain, diarrhea and constipation.  Endocrine: Negative for cold intolerance, heat intolerance, polydipsia, polyphagia and polyuria.  Genitourinary: Negative for dysuria, urgency and hematuria.  Musculoskeletal: Positive for myalgias, back pain and gait problem.  Neurological: Negative for dizziness, tremors, seizures, syncope, facial asymmetry, speech difficulty, weakness, light-headedness, numbness and headaches.    Past Medical History  Diagnosis Date  . CARDIAC ARRHYTHMIA 01/11/2010  . DEPRESSION 01/11/2010  . DIABETES MELLITUS, TYPE II 01/11/2010  . DYSPNEA 12/30/2010  . GERD 01/11/2010  . GOITER, MULTINODULAR 01/22/2010  . Headache(784.0) 11/30/2010  . HYPERTENSION 01/11/2010  . HYPERTHYROIDISM 01/22/2010  . LEUKOCYTOSIS UNSPECIFIED 01/11/2010  . Morbid obesity 01/11/2010  . SLEEP APNEA 01/11/2010  . TOBACCO ABUSE 01/11/2010  . UTERINE POLYP 01/11/2010  . Allergy   . Arthritis   . Chronic pain syndrome    Past Surgical History  Procedure Laterality Date  . Uterine polyp  2005    Removed  . Cesarean section  1987 and 1990    x 2  . Cholecystectomy  1992   Allergies  Allergen Reactions  . Droperidol    Current Outpatient Prescriptions  Medication Sig Dispense Refill  . clonazePAM (KLONOPIN) 1 MG tablet Take 1 mg by mouth at bedtime.    . docusate sodium (COLACE) 50 MG capsule Take by mouth 2 (two) times daily.    Marland Kitchen glucosamine-chondroitin 500-400 MG tablet Take 1 tablet by mouth 2 (two) times daily.     Marland Kitchen ibuprofen (ADVIL,MOTRIN) 200 MG tablet  Take 200 mg by mouth every 6 (six) hours as needed.    . traMADol (ULTRAM) 50 MG tablet Take 200 mg by mouth every 6 (six) hours as needed.     . cyclobenzaprine (FLEXERIL) 5 MG tablet Take 1 tablet (5 mg total) by mouth 4 (four) times daily as needed for muscle spasms. 120 tablet 5  . hydrochlorothiazide (HYDRODIURIL) 25 MG tablet Take 1 tablet (25 mg total) by mouth daily. 90 tablet 3  . lisinopril  (PRINIVIL,ZESTRIL) 10 MG tablet Take 1 tablet (10 mg total) by mouth daily. 90 tablet 3  . lovastatin (MEVACOR) 40 MG tablet Take 1 tablet (40 mg total) by mouth daily. 90 tablet 3  . metoprolol (LOPRESSOR) 100 MG tablet TAKE 1 TABLET BY MOUTH 2 TIMES DAILY. 180 tablet 3  . PRESCRIPTION MEDICATION Brintellix 10 mg  1 tablet daily     No current facility-administered medications for this visit.   Social History   Social History  . Marital Status: Divorced    Spouse Name: N/A  . Number of Children: N/A  . Years of Education: N/A   Occupational History  . Not on file.   Social History Main Topics  . Smoking status: Former Smoker -- 0.50 packs/day for 20 years    Types: Cigarettes  . Smokeless tobacco: Never Used     Comment: Divorced-moved to [place] to live with her mother  . Alcohol Use: No  . Drug Use: No  . Sexual Activity: Not Currently   Other Topics Concern  . Not on file   Social History Narrative   Marital status: Single; not dating; not interested       Children;  2 children in [place]; no grandchildren.  Mom in [place] so moved to [facility].      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     Seatbelt: 100%      ADLs: no assistant devices; independent with ADLs.        Sexual activity: not sexually active; last sexual activity 1996.     Family History  Problem Relation Age of Onset  . Emphysema Mother   . COPD Mother   . Diabetes Mother   . Cancer Mother 35    lung cancer  . Alcohol abuse Other     parent not sure which 1  . Arthritis Other     grandparent not sure which 1  . Colon cancer Other     Parent not sure which 1  . Heart disease Other   . Heart disease Father 69    CABG       Objective:    BP 109/73 mmHg  Pulse 85   Temp(Src) 97.9 F (36.6 C) (Oral)  Resp 16  Wt 247 lb (112.038 kg) Physical Exam  Constitutional: She is oriented to person, place, and time. She appears well-developed and well-nourished. No distress.  obese  HENT:  Head: Normocephalic and atraumatic.  Right Ear: External ear normal.  Left Ear: External ear normal.  Nose: Nose normal.  Mouth/Throat: Oropharynx is clear and moist.  Eyes: Conjunctivae and EOM are normal. Pupils are equal, round, and reactive to light.  Neck: Normal range of  motion. Neck supple. Carotid bruit is not present. No thyromegaly present.  Cardiovascular: Normal rate, regular rhythm, normal heart sounds and intact distal pulses.  Exam reveals no gallop and no friction rub.   No murmur heard. Pulmonary/Chest: Effort normal and breath sounds normal. She has no wheezes. She has no rales.  Abdominal: Soft. Bowel sounds are normal. She exhibits no distension and no mass. There is no hepatosplenomegaly. There is no tenderness. There is no rebound, no guarding and no CVA tenderness. A hernia is present. Hernia confirmed positive in the ventral area.  Lymphadenopathy:    She has no cervical adenopathy.  Neurological: She is alert and oriented to person, place, and time. No cranial nerve deficit.  Skin: Skin is warm and dry. No rash noted. She is not diaphoretic. No erythema. No pallor.  Psychiatric: She has a normal mood and affect. Her behavior is normal.        Assessment & Plan:   1. Essential hypertension   2. Degenerative disc disease, lumbar   3. Pure hypercholesterolemia   4. Glucose intolerance (impaired glucose tolerance)   5. Hematuria   6. Fatty infiltration of liver   7. Ventral hernia without obstruction or gangrene     1. HTN: controlled; no change in therapy. 2.  DDD lumbar spine: improved; s/p ortho consultation with MRI lumbar spine; s/p PT with some improvement; refill of Flexeril provided. 3. Hypercholesterolemia: stable; return in upcoming  month for fasting labs; no change to therapy. 4.  Glucose intolerance; stable; RTC one month for repeat labs.   5. Hematuria: resolved;s/p urology consultation; needs referral back to urology due to insurance requirements.   6. Fatty infiltration of liver: New; with elevated LFTs; recommend weight loss, exercise, low-fat food choices.  Will warrant hepatitis A and B vaccines if not previously administered. 7. Ventral hernia without obstruction: with pain, gas, etc.  Refer to general surgery for consultation. 8. Pulmonary nodule:  New.  15mm diameter; smoking history; repeat CT scan lung in 6-12 months.   Orders Placed This Encounter  Procedures  . Ambulatory referral to Urology    Referral Priority:  Routine    Referral Type:  Consultation    Referral Reason:  Specialty Services Required    Requested Specialty:  Urology    Number of Visits Requested:  1  . Ambulatory referral to General Surgery    Referral Priority:  Routine    Referral Type:  Surgical    Referral Reason:  Specialty Services Required    Requested Specialty:  General Surgery    Number of Visits Requested:  1    Meds ordered this encounter  Medications  . cyclobenzaprine (FLEXERIL) 5 MG tablet    Sig: Take 1 tablet (5 mg total) by mouth 4 (four) times daily as needed for muscle spasms.    Dispense:  120 tablet    Refill:  5    No Follow-up on file.    Kristi Elayne Guerin, M.D. [facility] [address] [place], [place]  [address] [phone] phone [phone] fax

## 2015-06-09 ENCOUNTER — Encounter: Payer: Self-pay | Admitting: Family Medicine

## 2015-06-10 ENCOUNTER — Encounter: Payer: Self-pay | Admitting: Surgery

## 2015-06-11 ENCOUNTER — Other Ambulatory Visit: Payer: Self-pay | Admitting: Family Medicine

## 2015-06-11 ENCOUNTER — Other Ambulatory Visit: Payer: Self-pay | Admitting: Physician Assistant

## 2015-06-15 ENCOUNTER — Encounter: Payer: Self-pay | Admitting: Family Medicine

## 2015-06-29 ENCOUNTER — Ambulatory Visit (INDEPENDENT_AMBULATORY_CARE_PROVIDER_SITE_OTHER): Payer: 59 | Admitting: Family Medicine

## 2015-06-29 ENCOUNTER — Encounter: Payer: Self-pay | Admitting: Family Medicine

## 2015-06-29 VITALS — BP 122/82 | HR 71 | Temp 98.7°F | Resp 16 | Ht 63.0 in | Wt 245.4 lb

## 2015-06-29 DIAGNOSIS — K219 Gastro-esophageal reflux disease without esophagitis: Secondary | ICD-10-CM | POA: Diagnosis not present

## 2015-06-29 DIAGNOSIS — I10 Essential (primary) hypertension: Secondary | ICD-10-CM

## 2015-06-29 DIAGNOSIS — E785 Hyperlipidemia, unspecified: Secondary | ICD-10-CM | POA: Diagnosis not present

## 2015-06-29 DIAGNOSIS — Z23 Encounter for immunization: Secondary | ICD-10-CM | POA: Diagnosis not present

## 2015-06-29 DIAGNOSIS — R7302 Impaired glucose tolerance (oral): Secondary | ICD-10-CM | POA: Diagnosis not present

## 2015-06-29 DIAGNOSIS — Z01419 Encounter for gynecological examination (general) (routine) without abnormal findings: Secondary | ICD-10-CM | POA: Diagnosis not present

## 2015-06-29 DIAGNOSIS — Z113 Encounter for screening for infections with a predominantly sexual mode of transmission: Secondary | ICD-10-CM

## 2015-06-29 DIAGNOSIS — E042 Nontoxic multinodular goiter: Secondary | ICD-10-CM | POA: Diagnosis not present

## 2015-06-29 DIAGNOSIS — Z1211 Encounter for screening for malignant neoplasm of colon: Secondary | ICD-10-CM

## 2015-06-29 DIAGNOSIS — Z114 Encounter for screening for human immunodeficiency virus [HIV]: Secondary | ICD-10-CM

## 2015-06-29 DIAGNOSIS — Z1231 Encounter for screening mammogram for malignant neoplasm of breast: Secondary | ICD-10-CM

## 2015-06-29 DIAGNOSIS — Z0000 Encounter for general adult medical examination without abnormal findings: Secondary | ICD-10-CM | POA: Diagnosis not present

## 2015-06-29 LAB — POCT URINALYSIS DIPSTICK
Blood, UA: NEGATIVE
GLUCOSE UA: NEGATIVE
LEUKOCYTES UA: NEGATIVE
Nitrite, UA: NEGATIVE
PH UA: 5.5
PROTEIN UA: 30
SPEC GRAV UA: 1.025
UROBILINOGEN UA: 0.2

## 2015-06-29 NOTE — Progress Notes (Signed)
Subjective:    Patient ID: Victoria [name], female    DOB: [birth year], 59 y.o.   MRN: [id number]  06/29/2015  Annual Exam and Medication Refill   HPI This 59 y.o. female presents for Complete Physical Examination.  Last physical:  never Pap smear:  2011 Mammogram:  2011 Colonoscopy:  Never; agreeable to schedule Bone density: never TDAP:  requesting Pneumovax:  never Influenza:  Never Hepatitis B series: in past. Eye exam:  2012; +glasses  Dental exam:  Seeing Dr. Aldona Lento.   Insomnia: taking Klonopin 1mg  qhs; started by pain management physician.    Requesting rechecking pulmonary nodule and kidney nodule in 10/2015.  Sees surgeon tomorrow to evaluate hernia ventral.   DDD lumbar spine: needs refill of Flexeril; helps with nerve impingement.      Review of Systems  Constitutional: Negative for fever, chills, diaphoresis, activity change, appetite change, fatigue and unexpected weight change.  HENT: Negative for congestion, dental problem, drooling, ear discharge, ear pain, facial swelling, hearing loss, mouth sores, nosebleeds, postnasal drip, rhinorrhea, sinus pressure, sneezing, sore throat, tinnitus, trouble swallowing and voice change.   Eyes: Negative for photophobia, pain, discharge, redness, itching and visual disturbance.  Respiratory: Negative for apnea, cough, choking, chest tightness, shortness of breath, wheezing and stridor.   Cardiovascular: Negative for chest pain, palpitations and leg swelling.  Gastrointestinal: Positive for abdominal pain. Negative for nausea, vomiting, diarrhea, constipation, blood in stool, abdominal distention, anal bleeding and rectal pain.  Endocrine: Positive for heat intolerance. Negative for cold intolerance, polydipsia, polyphagia and polyuria.  Genitourinary: Negative for dysuria, urgency, frequency, hematuria, flank pain, decreased urine volume, vaginal bleeding, vaginal discharge, enuresis, difficulty urinating, genital  sores, vaginal pain, menstrual problem, pelvic pain and dyspareunia.  Musculoskeletal: Positive for back pain, joint swelling, arthralgias and gait problem. Negative for myalgias, neck pain and neck stiffness.  Skin: Negative for color change, pallor, rash and wound.  Allergic/Immunologic: Negative for environmental allergies, food allergies and immunocompromised state.  Neurological: Negative for dizziness, tremors, seizures, syncope, facial asymmetry, speech difficulty, weakness, light-headedness, numbness and headaches.  Hematological: Negative for adenopathy. Does not bruise/bleed easily.  Psychiatric/Behavioral: Negative for suicidal ideas, hallucinations, behavioral problems, confusion, sleep disturbance, self-injury, dysphoric mood, decreased concentration and agitation. The patient is not nervous/anxious and is not hyperactive.     Past Medical History  Diagnosis Date  . CARDIAC ARRHYTHMIA 01/11/2010  . DEPRESSION 01/11/2010  . DIABETES MELLITUS, TYPE II 01/11/2010  . DYSPNEA 12/30/2010  . GERD 01/11/2010  . GOITER, MULTINODULAR 01/22/2010  . Headache(784.0) 11/30/2010  . HYPERTENSION 01/11/2010  . HYPERTHYROIDISM 01/22/2010  . LEUKOCYTOSIS UNSPECIFIED 01/11/2010  . Morbid obesity 01/11/2010  . SLEEP APNEA 01/11/2010  . TOBACCO ABUSE 01/11/2010  . UTERINE POLYP 01/11/2010  . Allergy   . Arthritis   . Chronic pain syndrome    Past Surgical History  Procedure Laterality Date  . Uterine polyp  2005    Removed  . Cesarean section  1987 and 1990    x 2  . Cholecystectomy  1992   Allergies  Allergen Reactions  . Droperidol    Current Outpatient Prescriptions  Medication Sig Dispense Refill  . clonazePAM (KLONOPIN) 1 MG tablet Take 1 mg by mouth at bedtime.    . cyclobenzaprine (FLEXERIL) 5 MG tablet Take 1 tablet (5 mg total) by mouth 4 (four) times daily as needed for muscle spasms. 120 tablet 5  . docusate sodium (COLACE) 50 MG capsule Take by mouth 2 (two) times daily.    Marland Kitchen  glucosamine-chondroitin 500-400 MG tablet Take 1 tablet by mouth 2 (two) times daily.     . hydrochlorothiazide (HYDRODIURIL) 25 MG tablet Take 1 tablet (25 mg total) by mouth daily. 90 tablet 3  . ibuprofen (ADVIL,MOTRIN) 200 MG tablet Take 200 mg by mouth every 6 (six) hours as needed.    Marland Kitchen lisinopril (PRINIVIL,ZESTRIL) 10 MG tablet Take 1 tablet (10 mg total) by mouth daily. 90 tablet 3  . metoprolol (LOPRESSOR) 100 MG tablet TAKE 1 TABLET BY MOUTH 2 TIMES DAILY. 180 tablet 3  . PRESCRIPTION MEDICATION Brintellix 10 mg  1 tablet daily    . lovastatin (MEVACOR) 40 MG tablet Take 1 tablet (40 mg total) by mouth daily. 90 tablet 3  . traMADol (ULTRAM) 50 MG tablet Take 200 mg by mouth every 6 (six) hours as needed.      No current facility-administered medications for this visit.   Social History   Social History  . Marital Status: Divorced    Spouse Name: N/A  . Number of Children: N/A  . Years of Education: N/A   Occupational History  . Not on file.   Social History Main Topics  . Smoking status: Former Smoker -- 0.50 packs/day for 20 years    Types: Cigarettes  . Smokeless tobacco: Never Used     Comment: Divorced-moved to [place] to live with her mother  . Alcohol Use: No  . Drug Use: No  . Sexual Activity: Not Currently   Other Topics Concern  . Not on file   Social History Narrative   Marital status: Single; not dating; not interested       Children;  2 children in [place]; no grandchildren.  Mom in [place] so moved to [facility].      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     Seatbelt: 100%      ADLs: no assistant devices; independent with ADLs.        Sexual activity: not sexually active; last sexual activity 1996.     Family History  Problem  Relation Age of Onset  . Emphysema Mother   . COPD Mother   . Diabetes Mother   . Cancer Mother 64    lung cancer  . Alcohol abuse Other     parent not sure which 1  . Arthritis Other     grandparent not sure which 1  . Colon cancer Other     Parent not sure which 1  . Heart disease Other   . Heart disease Father 86    CABG       Objective:    BP 122/82 mmHg  Pulse 71  Temp(Src) 98.7 F (37.1 C) (Oral)  Resp 16  Ht 5\' 3"  (1.6 m)  Wt 245 lb 6.4 oz (111.313 kg)  BMI 43.48 kg/m2  SpO2 96% Physical Exam  Constitutional: She is oriented to person, place, and time. She appears well-developed and well-nourished. No distress.  HENT:  Head: Normocephalic and atraumatic.  Right Ear: External ear normal.  Left Ear: External ear normal.  Nose: Nose normal.  Mouth/Throat: Oropharynx is clear and moist.  Eyes: Conjunctivae and EOM are normal. Pupils are equal, round, and  reactive to light.  Neck: Normal range of motion. Neck supple. Carotid bruit is not present. No thyromegaly present.  Cardiovascular: Normal rate, regular rhythm, normal heart sounds and intact distal pulses.  Exam reveals no gallop and no friction rub.   No murmur heard. Pulmonary/Chest: Effort normal and breath sounds normal. She has no wheezes. She has no rales. Right breast exhibits no inverted nipple, no mass, no nipple discharge, no skin change and no tenderness. Left breast exhibits no inverted nipple, no mass, no nipple discharge, no skin change and no tenderness. Breasts are symmetrical.  Abdominal: Soft. Bowel sounds are normal. She exhibits no distension and no mass. There is no tenderness. There is no rebound and no guarding.  Genitourinary: Rectum normal, vagina normal and uterus normal. There is no rash, tenderness or lesion on the right labia. There is no rash, tenderness or lesion on the left labia. Cervix exhibits no motion tenderness, no discharge and no friability. Right adnexum displays no mass, no  tenderness and no fullness. Left adnexum displays no mass, no tenderness and no fullness.  Lymphadenopathy:    She has no cervical adenopathy.  Neurological: She is alert and oriented to person, place, and time. No cranial nerve deficit.  Skin: Skin is warm and dry. No rash noted. She is not diaphoretic. No erythema. No pallor.  Psychiatric: She has a normal mood and affect. Her behavior is normal.   TDAP ADMINISTERED.     Assessment & Plan:   1. Routine physical examination   2. Hyperlipidemia   3. GOITER, MULTINODULAR   4. Gastroesophageal reflux disease without esophagitis   5. Glucose intolerance (impaired glucose tolerance)   6. Essential hypertension   7. Encounter for routine gynecological examination   8. Screening for HIV (human immunodeficiency virus)   9. Screening for STD (sexually transmitted disease)   10. Need for Tdap vaccination     Meds ordered this encounter  Medications  . DISCONTD: lovastatin (MEVACOR) 40 MG tablet    Sig: Take 1 tablet (40 mg total) by mouth daily.    Dispense:  90 tablet    Refill:  3  . lisinopril (PRINIVIL,ZESTRIL) 10 MG tablet    Sig: Take 1 tablet (10 mg total) by mouth daily.    Dispense:  90 tablet    Refill:  3  . metoprolol (LOPRESSOR) 100 MG tablet    Sig: TAKE 1 TABLET BY MOUTH 2 TIMES DAILY.    Dispense:  180 tablet    Refill:  3  . hydrochlorothiazide (HYDRODIURIL) 25 MG tablet    Sig: Take 1 tablet (25 mg total) by mouth daily.    Dispense:  90 tablet    Refill:  3    Orders Placed This Encounter  Procedures  . Tdap vaccine greater than or equal to 7yo IM  . CBC with Differential/Platelet  . Comprehensive metabolic panel    Order Specific Question:  Has the patient fasted?    Answer:  Yes  . Hemoglobin A1c  . Lipid panel    Order Specific Question:  Has the patient fasted?    Answer:  Yes  . TSH  . Microalbumin, urine  . HIV antibody  . RPR  . POCT urinalysis dipstick  . EKG 12-Lead    1.  COMPLETE  PHYSICAL EXAMINATION: anticipatory guidance ---exercise, weight loss, ASA 81mg  daily, 1200mg  calcium daily. Pap smear obtained; refer for mammogram.  Refer for colonoscopy.  S/p TDAP.   2.  Gynecological exam: pap smear  obtained; refer for mammogram.  Postmenopausal. 3.  Hyperlipidemia: stable; obtain labs. 4.  Goiter: stable; obtain TSH. 5.  GERD: stable; continue current medications. 6.  Glucose intolerance: stable; obtain labs.  7.  HTN: controlled; obtain labs;continue current medications. 8.  Screening STDs: obtain GC/Chlam/RPR/HIV. 9.  S/p tDAP.  Return in about 4 months (around 10/29/2015) for recheck.   Essie Gehret Elayne Guerin, M.D. [facility] [address] [place], [place]  [address] [phone] phone [phone] fax

## 2015-06-29 NOTE — Progress Notes (Signed)
   Subjective:    Patient ID: Victoria [name], female    DOB: 1956-07-01, 59 y.o.   MRN: [id number]  HPI    Review of Systems  Constitutional: Positive for fatigue.  HENT: Negative.   Eyes: Negative.   Respiratory: Negative.   Cardiovascular: Positive for palpitations.  Gastrointestinal: Negative.   Endocrine: Positive for heat intolerance.  Genitourinary: Positive for hematuria.  Musculoskeletal: Positive for myalgias, arthralgias, neck pain and neck stiffness.  Skin: Negative.   Allergic/Immunologic: Negative.   Neurological: Negative.   Hematological: Negative.   Psychiatric/Behavioral: Negative.        Objective:   Physical Exam        Assessment & Plan:

## 2015-06-29 NOTE — Patient Instructions (Signed)

## 2015-06-30 ENCOUNTER — Ambulatory Visit (INDEPENDENT_AMBULATORY_CARE_PROVIDER_SITE_OTHER): Payer: 59 | Admitting: Surgery

## 2015-06-30 ENCOUNTER — Encounter: Payer: Self-pay | Admitting: Surgery

## 2015-06-30 VITALS — BP 137/78 | HR 69 | Temp 98.9°F | Ht 63.0 in | Wt 247.6 lb

## 2015-06-30 DIAGNOSIS — K432 Incisional hernia without obstruction or gangrene: Secondary | ICD-10-CM | POA: Diagnosis not present

## 2015-06-30 LAB — COMPREHENSIVE METABOLIC PANEL
ALT: 14 U/L (ref 6–29)
AST: 21 U/L (ref 10–35)
Albumin: 3.9 g/dL (ref 3.6–5.1)
Alkaline Phosphatase: 57 U/L (ref 33–130)
BUN: 18 mg/dL (ref 7–25)
CALCIUM: 9.4 mg/dL (ref 8.6–10.4)
CO2: 28 mmol/L (ref 20–31)
CREATININE: 0.66 mg/dL (ref 0.50–1.05)
Chloride: 100 mmol/L (ref 98–110)
Glucose, Bld: 93 mg/dL (ref 65–99)
Potassium: 4.7 mmol/L (ref 3.5–5.3)
Sodium: 140 mmol/L (ref 135–146)
Total Bilirubin: 0.4 mg/dL (ref 0.2–1.2)
Total Protein: 6.6 g/dL (ref 6.1–8.1)

## 2015-06-30 LAB — CBC WITH DIFFERENTIAL/PLATELET
Basophils Absolute: 0.1 10*3/uL (ref 0.0–0.1)
Basophils Relative: 1 % (ref 0–1)
EOS PCT: 3 % (ref 0–5)
Eosinophils Absolute: 0.2 10*3/uL (ref 0.0–0.7)
HEMATOCRIT: 39.4 % (ref 36.0–46.0)
Hemoglobin: 13.4 g/dL (ref 12.0–15.0)
LYMPHS PCT: 35 % (ref 12–46)
Lymphs Abs: 2.9 10*3/uL (ref 0.7–4.0)
MCH: 28.2 pg (ref 26.0–34.0)
MCHC: 34 g/dL (ref 30.0–36.0)
MCV: 82.9 fL (ref 78.0–100.0)
MPV: 9 fL (ref 8.6–12.4)
Monocytes Absolute: 0.5 10*3/uL (ref 0.1–1.0)
Monocytes Relative: 6 % (ref 3–12)
NEUTROS PCT: 55 % (ref 43–77)
Neutro Abs: 4.6 10*3/uL (ref 1.7–7.7)
Platelets: 263 10*3/uL (ref 150–400)
RBC: 4.75 MIL/uL (ref 3.87–5.11)
RDW: 14.5 % (ref 11.5–15.5)
WBC: 8.3 10*3/uL (ref 4.0–10.5)

## 2015-06-30 LAB — LIPID PANEL
CHOLESTEROL: 222 mg/dL — AB (ref 125–200)
HDL: 43 mg/dL — AB (ref >=46)
LDL Cholesterol: 112 mg/dL (ref <130)
Total CHOL/HDL Ratio: 5.2 Ratio — ABNORMAL HIGH (ref <=5.0)
Triglycerides: 334 mg/dL — ABNORMAL HIGH (ref <150)
VLDL: 67 mg/dL — ABNORMAL HIGH (ref <30)

## 2015-06-30 LAB — TSH: TSH: 1.588 u[IU]/mL (ref 0.350–4.500)

## 2015-06-30 LAB — HEMOGLOBIN A1C
HEMOGLOBIN A1C: 6.4 % — AB (ref <5.7)
Mean Plasma Glucose: 137 mg/dL — ABNORMAL HIGH (ref <117)

## 2015-06-30 LAB — MICROALBUMIN, URINE: Microalb, Ur: 4.2 mg/dL — ABNORMAL HIGH (ref <2.0)

## 2015-06-30 NOTE — Patient Instructions (Signed)
You only need to follow-up with [place] if you have a change in your symptoms.   If you develop severe pain in this area, continuous vomiting, or fever >100.5. Please call our office; If we are closed, please go to the Emergency Room ASAP.

## 2015-07-01 DIAGNOSIS — K432 Incisional hernia without obstruction or gangrene: Secondary | ICD-10-CM | POA: Insufficient documentation

## 2015-07-01 LAB — PAP IG, CT-NG NAA, HPV HIGH-RISK
Chlamydia Probe Amp: NEGATIVE
GC PROBE AMP: NEGATIVE
HPV DNA HIGH RISK: NOT DETECTED

## 2015-07-01 LAB — RPR

## 2015-07-01 LAB — HIV ANTIBODY (ROUTINE TESTING W REFLEX): HIV 1&2 Ab, 4th Generation: NONREACTIVE

## 2015-07-01 NOTE — Progress Notes (Signed)
Patient ID: Victoria [name], female   DOB: 02/10/1956, 59 y.o.   MRN: [id number]  Chief Complaint  Patient presents with  . Abdominal Pain    HPI  Victoria [name] is a 59 y.o. female.  Who was referred by her primary care physician with a 25 year history of a stable ventral hernia above her umbilicus. She had a cholecystectomy done 25 years ago 6 months after which she started developing swelling in the area which has been fairly stable. The patient denies any significant pain from this area of the hernia. She's had no history of prior repair no history of incarceration nausea vomiting or signs of obstruction. She is not too interested in having this repaired at this time. She recently relocated to [place] from [place].  Past Medical History  Diagnosis Date  . CARDIAC ARRHYTHMIA 01/11/2010  . DEPRESSION 01/11/2010  . DIABETES MELLITUS, TYPE II 01/11/2010  . DYSPNEA 12/30/2010  . GERD 01/11/2010  . GOITER, MULTINODULAR 01/22/2010  . Headache(784.0) 11/30/2010  . HYPERTENSION 01/11/2010  . HYPERTHYROIDISM 01/22/2010  . LEUKOCYTOSIS UNSPECIFIED 01/11/2010  . Morbid obesity 01/11/2010  . SLEEP APNEA 01/11/2010  . TOBACCO ABUSE 01/11/2010  . UTERINE POLYP 01/11/2010  . Allergy   . Arthritis   . Chronic pain syndrome     Past Surgical History  Procedure Laterality Date  . Uterine polyp  2005    Removed  . Cesarean section  1987 and 1990    x 2  . Cholecystectomy  1992    Family History  Problem Relation Age of Onset  . Emphysema Mother   . COPD Mother   . Diabetes Mother   . Cancer Mother 24    lung cancer  . Alcohol abuse Other     parent not sure which 1  . Arthritis Other     grandparent not sure which 1  . Colon cancer Other     Parent not sure which 1  . Heart disease Other   . Heart disease Father 74    CABG    Social History Social History  Substance Use Topics  . Smoking status: Former Smoker -- 0.50 packs/day for 20 years    Types: Cigarettes  . Smokeless  tobacco: Never Used     Comment: Divorced-moved to [place] to live with her mother  . Alcohol Use: No    Allergies  Allergen Reactions  . Droperidol     Current Outpatient Prescriptions  Medication Sig Dispense Refill  . clonazePAM (KLONOPIN) 1 MG tablet Take 1 mg by mouth at bedtime.    . cyclobenzaprine (FLEXERIL) 5 MG tablet Take 1 tablet (5 mg total) by mouth 4 (four) times daily as needed for muscle spasms. 120 tablet 5  . docusate sodium (COLACE) 50 MG capsule Take by mouth 2 (two) times daily.    Marland Kitchen glucosamine-chondroitin 500-400 MG tablet Take 1 tablet by mouth 2 (two) times daily.     . hydrochlorothiazide (HYDRODIURIL) 25 MG tablet TAKE 1 TABLET BY MOUTH DAILY. 90 tablet 0  . ibuprofen (ADVIL,MOTRIN) 200 MG tablet Take 200 mg by mouth every 6 (six) hours as needed.    Marland Kitchen lisinopril (PRINIVIL,ZESTRIL) 10 MG tablet TAKE 1 TABLET BY MOUTH DAILY. 90 tablet 0  . lovastatin (MEVACOR) 20 MG tablet TAKE 1 TABLET BY MOUTH DAILY. 90 tablet 0  . metoprolol (LOPRESSOR) 100 MG tablet TAKE 1 TABLET BY MOUTH 2 TIMES DAILY. 180 tablet 0  . PRESCRIPTION MEDICATION Brintellix 10 mg  1 tablet  daily    . traMADol (ULTRAM) 50 MG tablet Take 200 mg by mouth every 6 (six) hours as needed.      No current facility-administered medications for this visit.    Blood pressure 137/78, pulse 69, temperature 98.9 F (37.2 C), temperature source Oral, height 5' 3"  (1.6 m), weight 112.311 kg (247 lb 9.6 oz).  Results for orders placed or performed in visit on 06/29/15 (from the past 48 hour(s))  CBC with Differential/Platelet     Status: None   Collection Time: 06/29/15  2:11 PM  Result Value Ref Range   WBC 8.3 4.0 - 10.5 K/uL   RBC 4.75 3.87 - 5.11 MIL/uL   Hemoglobin 13.4 12.0 - 15.0 g/dL   HCT 39.4 36.0 - 46.0 %   MCV 82.9 78.0 - 100.0 fL   MCH 28.2 26.0 - 34.0 pg   MCHC 34.0 30.0 - 36.0 g/dL   RDW 14.5 11.5 - 15.5 %   Platelets 263 150 - 400 K/uL   MPV 9.0 8.6 - 12.4 fL   Neutrophils Relative %  55 43 - 77 %   Neutro Abs 4.6 1.7 - 7.7 K/uL   Lymphocytes Relative 35 12 - 46 %   Lymphs Abs 2.9 0.7 - 4.0 K/uL   Monocytes Relative 6 3 - 12 %   Monocytes Absolute 0.5 0.1 - 1.0 K/uL   Eosinophils Relative 3 0 - 5 %   Eosinophils Absolute 0.2 0.0 - 0.7 K/uL   Basophils Relative 1 0 - 1 %   Basophils Absolute 0.1 0.0 - 0.1 K/uL   Smear Review Criteria for review not met   Comprehensive metabolic panel     Status: None   Collection Time: 06/29/15  2:11 PM  Result Value Ref Range   Sodium 140 135 - 146 mmol/L   Potassium 4.7 3.5 - 5.3 mmol/L   Chloride 100 98 - 110 mmol/L   CO2 28 20 - 31 mmol/L   Glucose, Bld 93 65 - 99 mg/dL   BUN 18 7 - 25 mg/dL   Creat 0.66 0.50 - 1.05 mg/dL   Total Bilirubin 0.4 0.2 - 1.2 mg/dL   Alkaline Phosphatase 57 33 - 130 U/L   AST 21 10 - 35 U/L   ALT 14 6 - 29 U/L   Total Protein 6.6 6.1 - 8.1 g/dL   Albumin 3.9 3.6 - 5.1 g/dL   Calcium 9.4 8.6 - 10.4 mg/dL  Hemoglobin A1c     Status: Abnormal   Collection Time: 06/29/15  2:11 PM  Result Value Ref Range   Hgb A1c MFr Bld 6.4 (H) <5.7 %    Comment:                                                                        According to the ADA Clinical Practice Recommendations for 2011, when HbA1c is used as a screening test:     >=6.5%   Diagnostic of Diabetes Mellitus            (if abnormal result is confirmed)   5.7-6.4%   Increased risk of developing Diabetes Mellitus   References:Diagnosis and Classification of Diabetes Mellitus,Diabetes BSJG,2836,62(HUTML 1):S62-S69 and Standards of Medical Care in  Diabetes - 2011,Diabetes Care,2011,34 (Suppl 1):S11-S61.      Mean Plasma Glucose 137 (H) <117 mg/dL    Comment:   Footnotes:  (1) ** Please note change in unit of measure and reference range(s). **     Lipid panel     Status: Abnormal   Collection Time: 06/29/15  2:11 PM  Result Value Ref Range   Cholesterol 222 (H) 125 - 200 mg/dL   Triglycerides 334 (H) <150 mg/dL   HDL 43  (L) >=46 mg/dL   Total CHOL/HDL Ratio 5.2 (H) <=5.0 Ratio   VLDL 67 (H) <30 mg/dL   LDL Cholesterol 112 <130 mg/dL    Comment:   Total Cholesterol/HDL Ratio:CHD Risk                        Coronary Heart Disease Risk Table                                        Men       Women          1/2 Average Risk              3.4        3.3              Average Risk              5.0        4.4           2X Average Risk              9.6        7.1           3X Average Risk             23.4       11.0 Use the calculated Patient Ratio above and the CHD Risk table  to determine the patient's CHD Risk.   TSH     Status: None   Collection Time: 06/29/15  2:11 PM  Result Value Ref Range   TSH 1.588 0.350 - 4.500 uIU/mL  HIV antibody     Status: None   Collection Time: 06/29/15  2:59 PM  Result Value Ref Range   HIV 1&2 Ab, 4th Generation NONREACTIVE NONREACTIVE    Comment:   HIV-1 antigen and HIV-1/HIV-2 antibodies were not detected.  There is no laboratory evidence of HIV infection.   HIV-1/2 Antibody Diff        Not indicated. HIV-1 RNA, Qual TMA          Not indicated.     PLEASE NOTE: This information has been disclosed to you from records whose confidentiality may be protected by state law. If your state requires such protection, then the state law prohibits you from making any further disclosure of the information without the specific written consent of the person to whom it pertains, or as otherwise permitted by law. A general authorization for the release of medical or other information is NOT sufficient for this purpose.   The performance of this assay has not been clinically validated in patients less than 80 years old.   For additional information please refer to http://education.questdiagnostics.com/faq/FAQ106.  (This link is being provided for informational/educational purposes only.)     RPR     Status: None   Collection Time: 06/29/15  2:59  PM  Result Value Ref Range    RPR Ser Ql NON REAC NON REAC  Pap IG, CT/NG NAA, and HPV (high risk)     Status: None (Preliminary result)   Collection Time: 06/29/15  2:59 PM  Result Value Ref Range   HPV DNA High Risk     Specimen adequacy: SEE NOTE     Comment: SATISFACTORY.  Endocervical/transformation zone component absent/insufficient.    FINAL DIAGNOSIS: SEE NOTE     Comment: - NEGATIVE FOR INTRAEPITHELIAL LESIONS OR MALIGNANCY.    Cytotechnologist: SEE NOTE     Comment: JWW, BS CT(ASCP), MLT(ASCP)   Chlamydia Probe Amp NEGATIVE    GC Probe Amp NEGATIVE     Comment:   Footnotes:  (1)                      **Normal Reference Range: Negative**          Assay performed using the Gen-Probe APTIMA COMBO2 (R) Assay.     Microalbumin, urine     Status: Abnormal   Collection Time: 06/29/15  3:27 PM  Result Value Ref Range   Microalb, Ur 4.2 (H) <2.0 mg/dL    Comment: The ADA (Diabetes Care 0867;61(PJKDT 1):S14-S80) has defined abnormalities in albumin excretion as follows:            Category           Result                            (mg/g creatinine)                 Normal:    <30       Microalbuminuria:    30 - 299   Clinical albuminuria:    > or = 300    The ADA recommends that at least two of three specimens collected within a 3 - 6 month period be abnormal before considering a patient to be within a diagnostic category.   POCT urinalysis dipstick     Status: None   Collection Time: 06/29/15  3:31 PM  Result Value Ref Range   Color, UA dark yellow    Clarity, UA cloudy    Glucose, UA neg    Bilirubin, UA small    Ketones, UA trace    Spec Grav, UA 1.025    Blood, UA neg    pH, UA 5.5    Protein, UA 30    Urobilinogen, UA 0.2    Nitrite, UA neg    Leukocytes, UA Negative Negative   No results found.  Review of Systems  Constitutional: Negative.   HENT: Negative.   Respiratory: Negative.   Cardiovascular: Negative.   Gastrointestinal: Negative for nausea, vomiting and abdominal  pain.  Genitourinary: Negative.   Endo/Heme/Allergies: Negative.   Psychiatric/Behavioral: Negative.     Physical Exam  Constitutional: She is oriented to person, place, and time and well-developed, well-nourished, and in no distress.  HENT:  Head: Normocephalic and atraumatic.  Eyes: Pupils are equal, round, and reactive to light.  Neck: Normal range of motion.  Cardiovascular: Normal rate and regular rhythm.   Pulmonary/Chest: Effort normal and breath sounds normal. No respiratory distress. She has no wheezes.  Abdominal: Soft. Normal appearance and bowel sounds are normal. She exhibits no distension. There is no hepatomegaly. There is no tenderness. There is no rebound and no CVA tenderness. A hernia is present.  Neurological: She is oriented to person, place, and time.  Skin: Skin is warm and dry. She is not diaphoretic.  Psychiatric: Mood, memory, affect and judgment normal.   Physical Exam CONSTITUTIONAL:  Pleasant, well-developed, well-nourished, and in no acute distress. EYES: Pupils equal and reactive to light, Sclera non-icteric EARS, NOSE, MOUTH AND THROAT:  The oropharynx was clear.  Dentition is good repair.  Oral mucosa pink and moist. LYMPH NODES:  Lymph nodes in the neck and axillae were normal RESPIRATORY:  Lungs were clear.  Normal respiratory effort without pathologic use of accessory muscles of respiration CARDIOVASCULAR: Heart was regular without murmurs.  There were no carotid bruits. GI: The abdomen was soft, nontender, and nondistended. There were no palpable masses. There was no hepatosplenomegaly. There were normal bowel sounds in all quadrants. GU:  Rectal deferred.   MUSCULOSKELETAL:  Normal muscle strength and tone.  No clubbing or cyanosis.   SKIN:  There were no pathologic skin lesions.  There were no nodules on palpation. NEUROLOGIC:  Sensation is normal.  Cranial nerves are grossly intact. PSYCH:  Oriented to person, place and time.  Mood and affect  are normal.   Assessment     this is a 59 year old obese white female with a long-standing stable appearing incisional ventral hernia situated above the umbilicus. At present she is having no symptoms. And as such I would not repair the hernia.    Plan     She was told to follow up with [place] in the office as needed. She is a retired Warden/ranger from the surgical unit and is well aware of the symptoms to look for.       Sherri Rad MD, FACS 07/01/2015, 1:30 PM

## 2015-07-02 ENCOUNTER — Encounter: Payer: Self-pay | Admitting: Family Medicine

## 2015-07-02 ENCOUNTER — Other Ambulatory Visit: Payer: Self-pay

## 2015-07-02 MED ORDER — LISINOPRIL 10 MG PO TABS: 10.0000 mg | ORAL_TABLET | Freq: Every day | ORAL | Status: DC

## 2015-07-02 MED ORDER — LOVASTATIN 40 MG PO TABS: 40.0000 mg | ORAL_TABLET | Freq: Every day | ORAL | Status: DC

## 2015-07-02 MED ORDER — HYDROCHLOROTHIAZIDE 25 MG PO TABS: 25.0000 mg | ORAL_TABLET | Freq: Every day | ORAL | Status: DC

## 2015-07-02 MED ORDER — METOPROLOL TARTRATE 100 MG PO TABS: ORAL_TABLET | ORAL | Status: DC

## 2015-07-13 IMAGING — CR DG SHOULDER 2+V*R*
3 series · 3 of 3 positions shown · non-contrast
Comparison: None.

CLINICAL DATA: Fall, right shoulder pain

EXAM:
RIGHT SHOULDER - 2+ VIEW

[AP (1 of 2)]
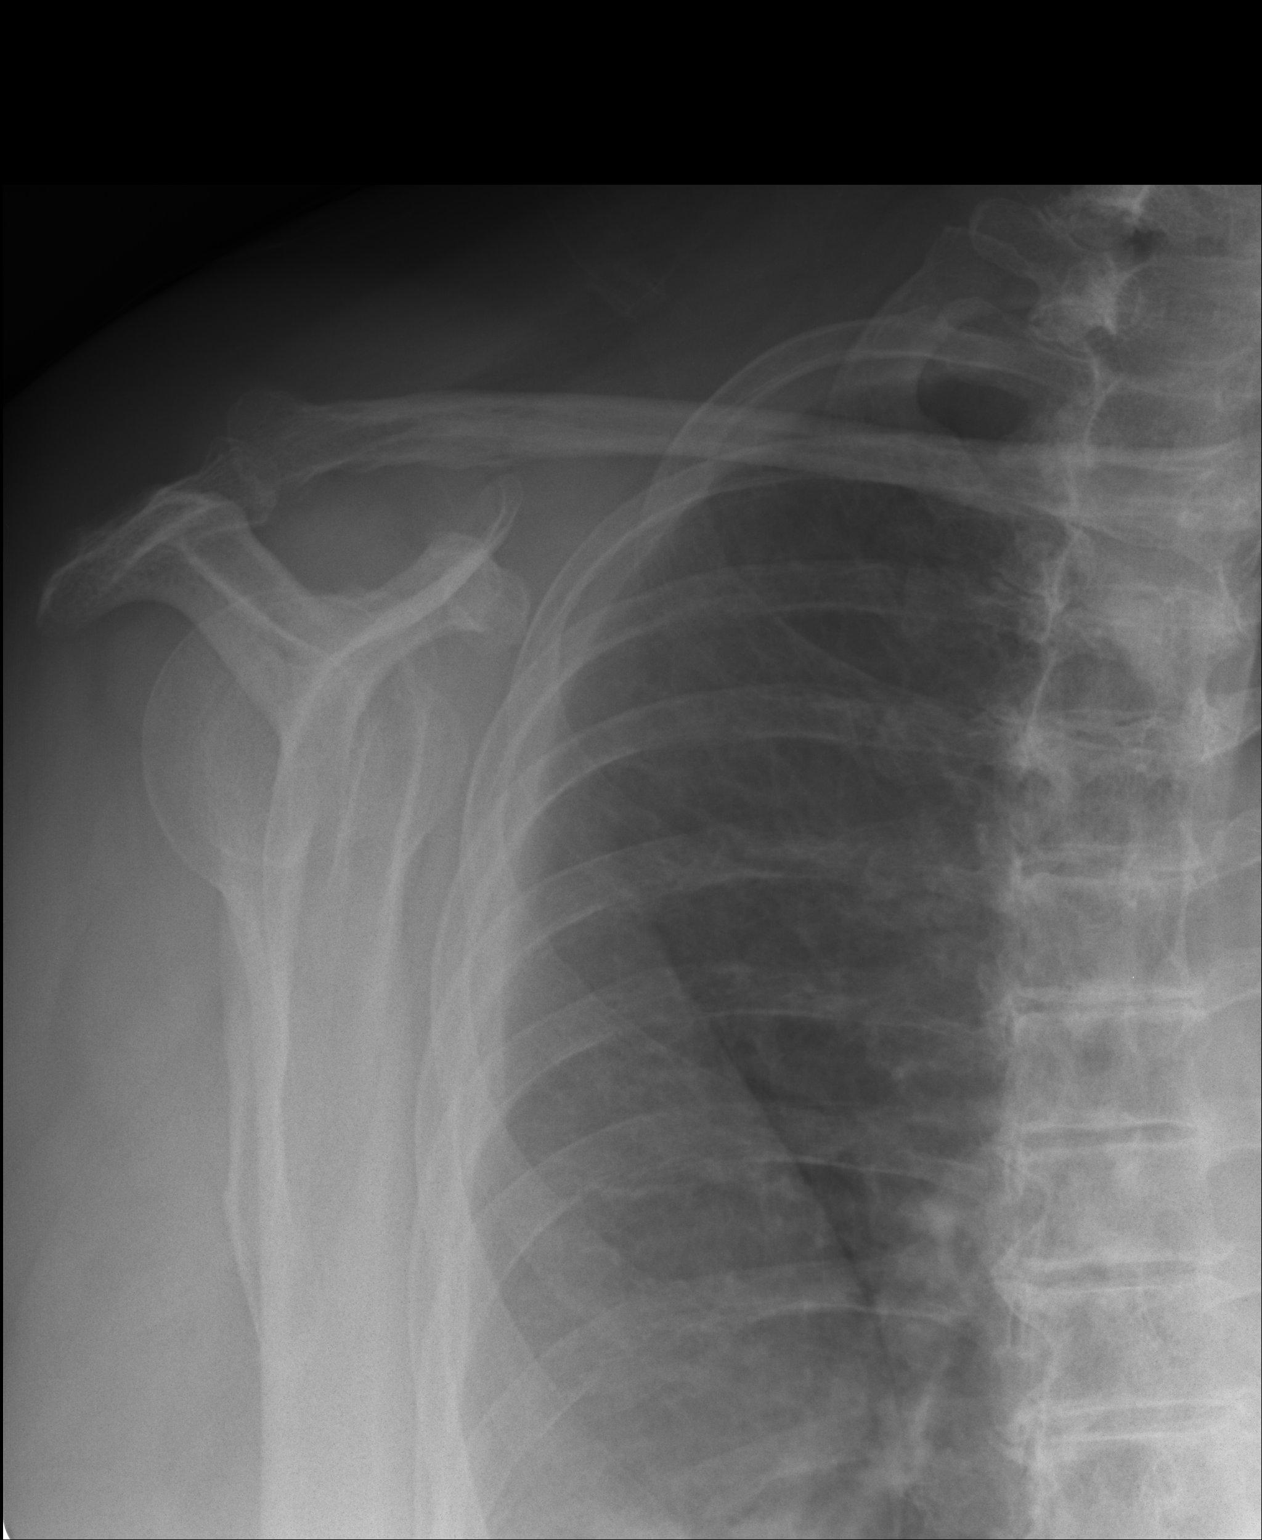

[lateral]
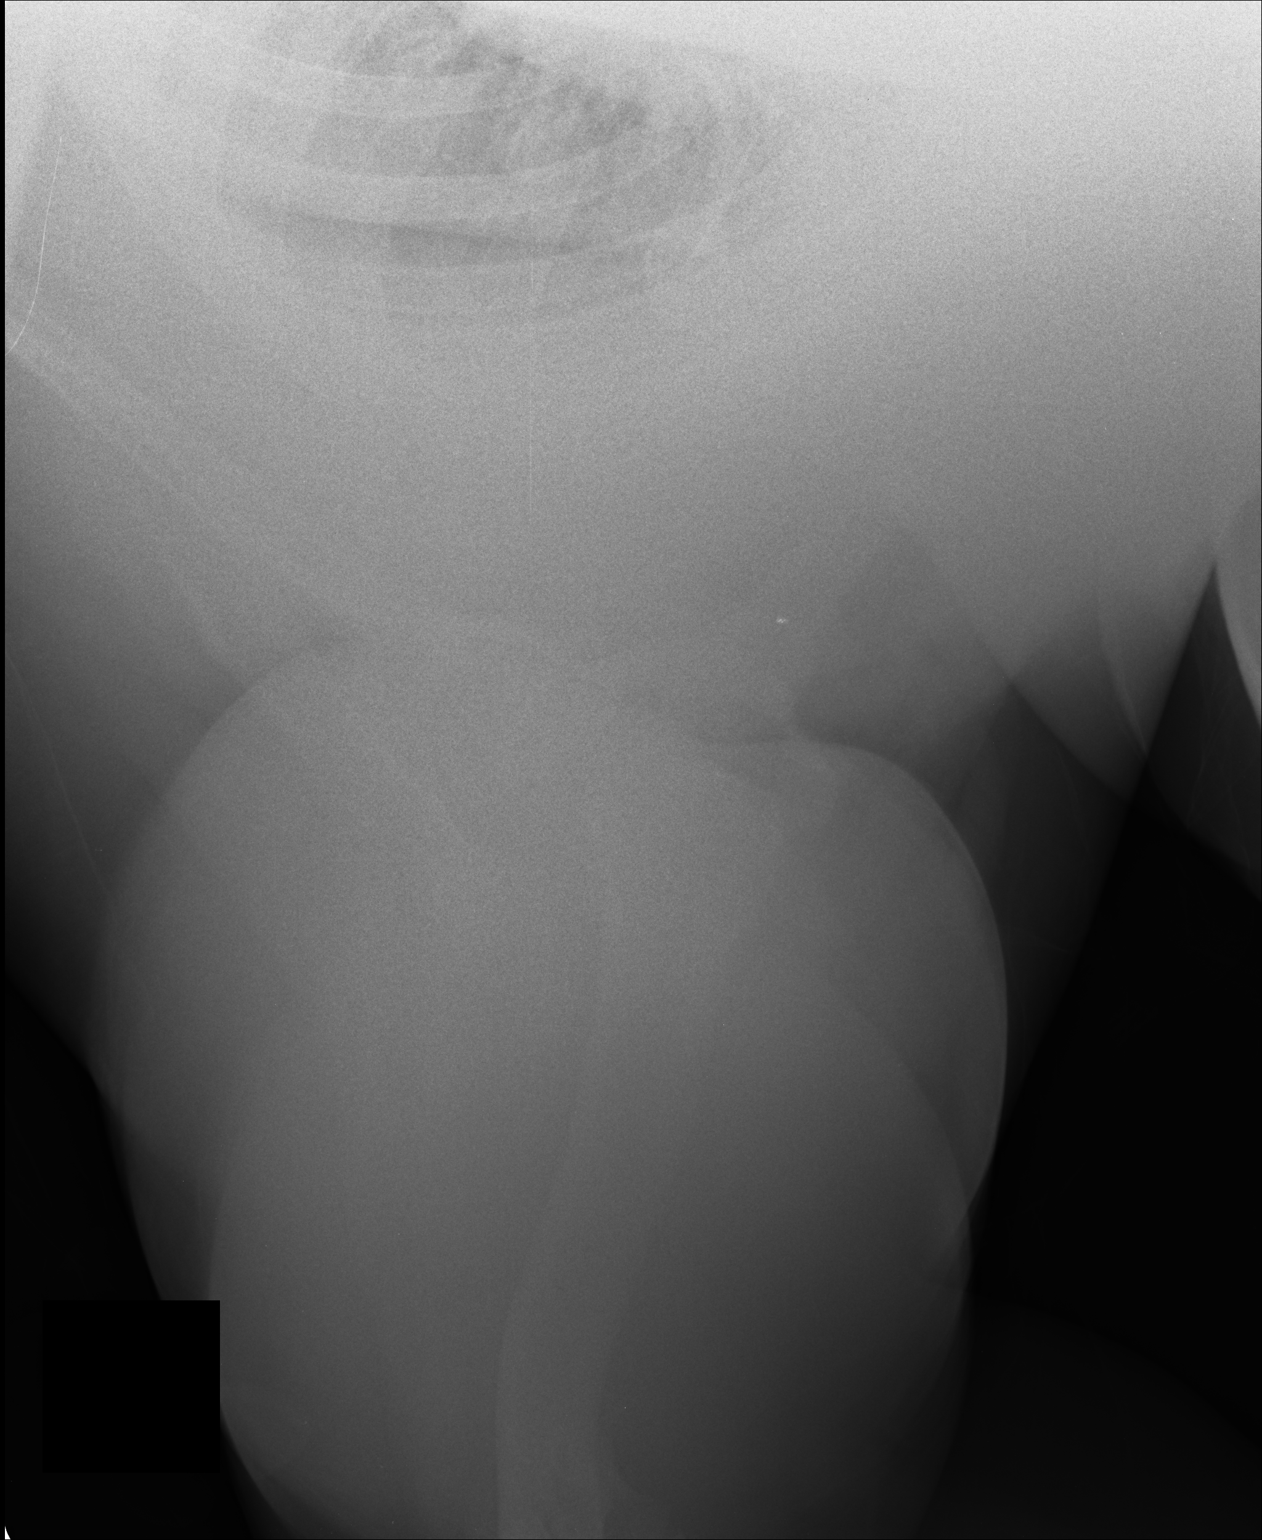

[AP (2 of 2)]
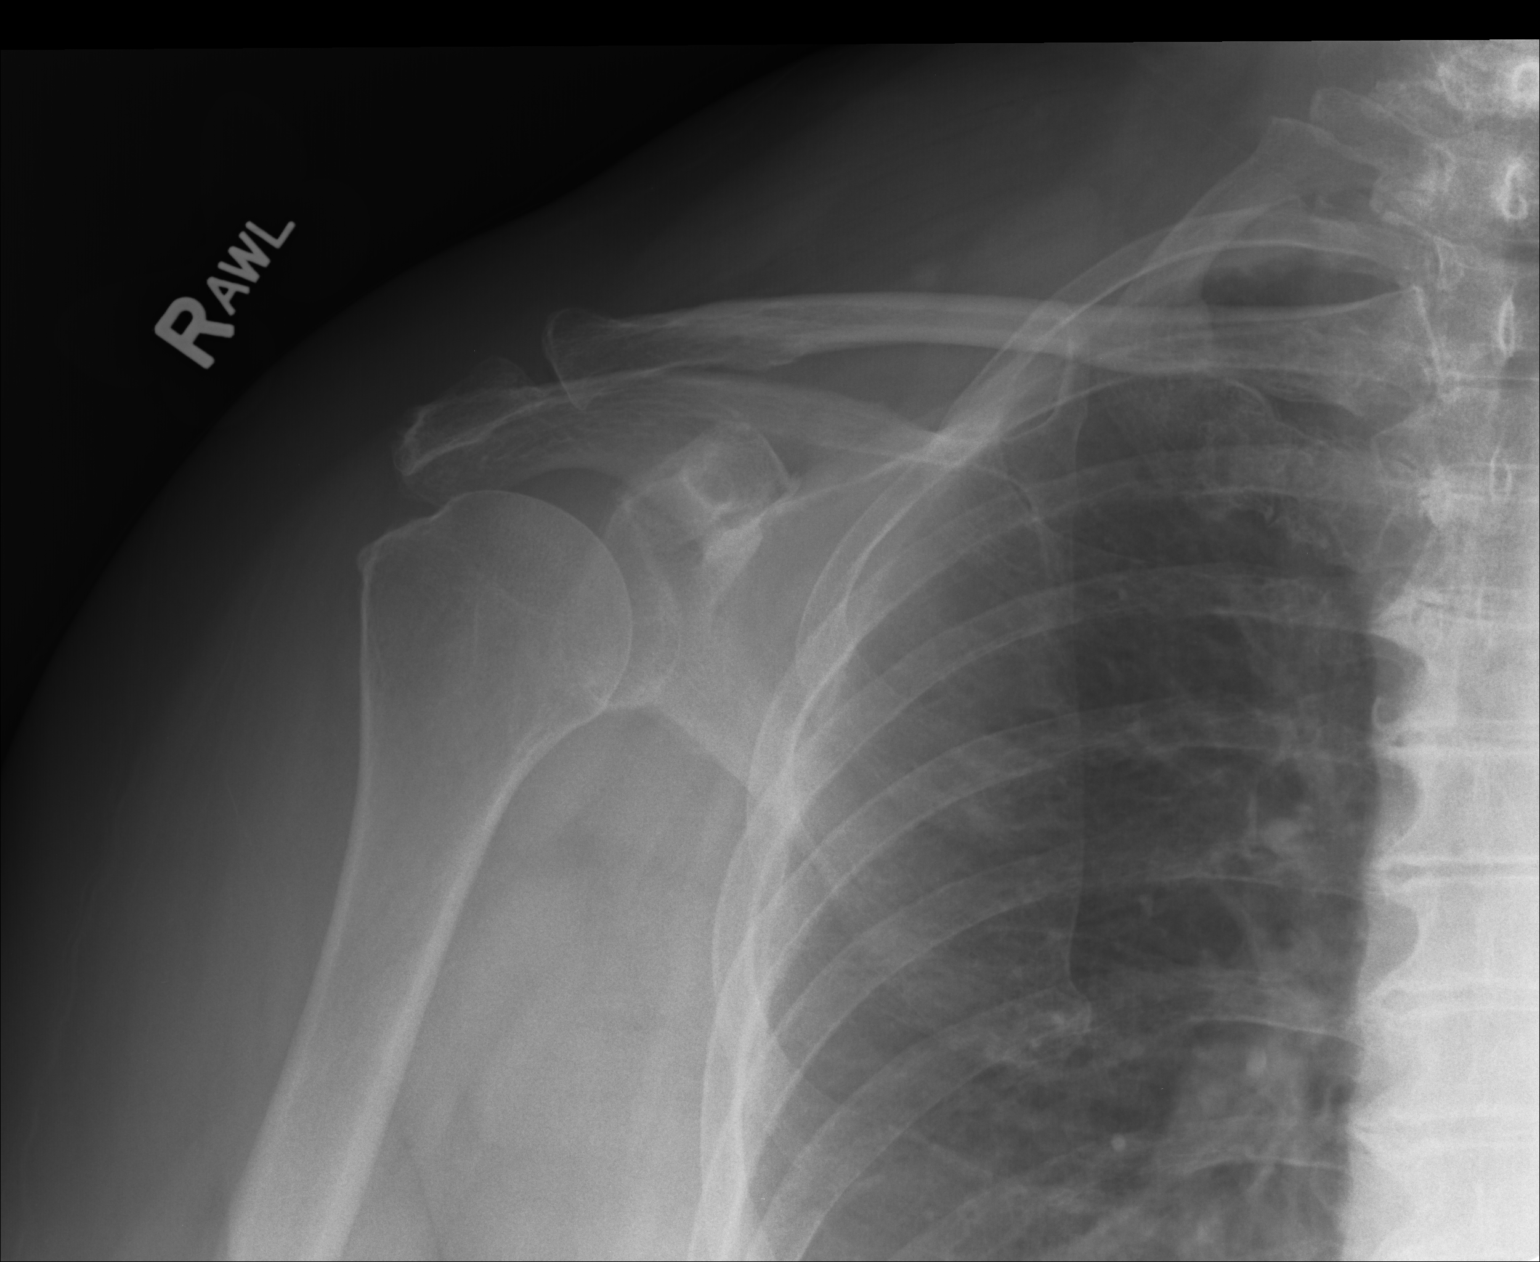

[3 of 3 positions shown; findings below may reference images not displayed]

FINDINGS: No fracture or dislocation is seen.

The joint spaces are preserved.

Visualized right lung is clear.
IMPRESSION: No fracture or dislocation is seen.

## 2015-07-23 ENCOUNTER — Encounter: Payer: Self-pay | Admitting: Family Medicine

## 2015-07-23 DIAGNOSIS — H539 Unspecified visual disturbance: Secondary | ICD-10-CM

## 2015-07-23 DIAGNOSIS — R7302 Impaired glucose tolerance (oral): Secondary | ICD-10-CM

## 2015-07-27 ENCOUNTER — Other Ambulatory Visit: Payer: Self-pay

## 2015-07-27 DIAGNOSIS — Z1231 Encounter for screening mammogram for malignant neoplasm of breast: Secondary | ICD-10-CM

## 2015-07-29 ENCOUNTER — Other Ambulatory Visit: Payer: Self-pay | Admitting: Family Medicine

## 2015-07-29 DIAGNOSIS — R928 Other abnormal and inconclusive findings on diagnostic imaging of breast: Secondary | ICD-10-CM

## 2015-07-30 NOTE — Addendum Note (Signed)
Addended by: [name] on: 07/30/2015 10:38 PM   Modules accepted: Orders

## 2015-08-05 ENCOUNTER — Other Ambulatory Visit: Payer: Self-pay

## 2015-08-07 ENCOUNTER — Ambulatory Visit: Payer: 59 | Admitting: Family Medicine

## 2015-08-12 ENCOUNTER — Other Ambulatory Visit: Payer: Self-pay

## 2015-08-17 ENCOUNTER — Other Ambulatory Visit: Payer: Self-pay

## 2015-08-19 ENCOUNTER — Ambulatory Visit
Admission: RE | Admit: 2015-08-19 | Discharge: 2015-08-19 | Disposition: A | Discharge status: Home / Self Care | Payer: 59 | Source: Ambulatory Visit | Attending: Family Medicine | Admitting: Family Medicine

## 2015-08-19 ENCOUNTER — Ambulatory Visit: Payer: Self-pay

## 2015-08-19 ENCOUNTER — Encounter: Payer: Self-pay | Admitting: Family Medicine

## 2015-08-19 DIAGNOSIS — R928 Other abnormal and inconclusive findings on diagnostic imaging of breast: Secondary | ICD-10-CM

## 2015-08-19 DIAGNOSIS — Z1231 Encounter for screening mammogram for malignant neoplasm of breast: Secondary | ICD-10-CM

## 2015-08-25 ENCOUNTER — Encounter: Payer: Self-pay | Admitting: Internal Medicine

## 2015-08-25 ENCOUNTER — Telehealth: Payer: Self-pay

## 2015-08-25 NOTE — Telephone Encounter (Signed)
Pt has more questions about her having a mammogram

## 2015-09-01 LAB — HM DIABETES EYE EXAM

## 2015-10-05 ENCOUNTER — Other Ambulatory Visit: Payer: Self-pay

## 2015-10-05 DIAGNOSIS — Z1231 Encounter for screening mammogram for malignant neoplasm of breast: Secondary | ICD-10-CM

## 2015-10-26 ENCOUNTER — Ambulatory Visit: Payer: 59 | Admitting: Family Medicine

## 2015-11-04 ENCOUNTER — Encounter: Payer: Self-pay | Admitting: Internal Medicine

## 2015-11-06 ENCOUNTER — Ambulatory Visit: Payer: 59 | Admitting: Family Medicine

## 2015-11-10 ENCOUNTER — Encounter: Payer: 59 | Admitting: Internal Medicine

## 2015-12-08 ENCOUNTER — Other Ambulatory Visit: Payer: Self-pay | Admitting: Family Medicine

## 2015-12-09 ENCOUNTER — Telehealth: Payer: Self-pay

## 2015-12-09 NOTE — Telephone Encounter (Signed)
Pt would like to speak with someone regarding her medication, states we called in 1 of 3 and was told it was to soon but it isn't pt states. Please call [phone]

## 2015-12-10 MED ORDER — METOPROLOL TARTRATE 100 MG PO TABS: ORAL_TABLET | ORAL | Status: DC

## 2015-12-10 MED ORDER — HYDROCHLOROTHIAZIDE 25 MG PO TABS: 25.0000 mg | ORAL_TABLET | Freq: Every day | ORAL | Status: DC

## 2015-12-10 NOTE — Telephone Encounter (Signed)
Rx sent in. Pt switched pharmacies. She has an appt with Dr. Tamala Julian coming up.

## 2015-12-14 ENCOUNTER — Telehealth: Payer: Self-pay | Admitting: Family Medicine

## 2015-12-14 NOTE — Telephone Encounter (Signed)
SPOKE WITH PATIENT AND SHE HAS HER COLONOSCOPY SCHEDULED ON 01-14-16 WITH [facility].  SHE IS ALSO GETTING HER MAMMOGRAM SCHEDULED.

## 2015-12-21 ENCOUNTER — Encounter: Payer: Self-pay | Admitting: Family Medicine

## 2015-12-21 ENCOUNTER — Ambulatory Visit (INDEPENDENT_AMBULATORY_CARE_PROVIDER_SITE_OTHER): Payer: BLUE CROSS/BLUE SHIELD | Admitting: Family Medicine

## 2015-12-21 VITALS — BP 118/80 | HR 69 | Temp 98.3°F | Resp 16 | Ht 63.75 in | Wt 252.0 lb

## 2015-12-21 DIAGNOSIS — L409 Psoriasis, unspecified: Secondary | ICD-10-CM

## 2015-12-21 DIAGNOSIS — I10 Essential (primary) hypertension: Secondary | ICD-10-CM | POA: Diagnosis not present

## 2015-12-21 DIAGNOSIS — S76011A Strain of muscle, fascia and tendon of right hip, initial encounter: Secondary | ICD-10-CM

## 2015-12-21 DIAGNOSIS — K432 Incisional hernia without obstruction or gangrene: Secondary | ICD-10-CM | POA: Diagnosis not present

## 2015-12-21 DIAGNOSIS — R7302 Impaired glucose tolerance (oral): Secondary | ICD-10-CM | POA: Insufficient documentation

## 2015-12-21 DIAGNOSIS — E785 Hyperlipidemia, unspecified: Secondary | ICD-10-CM

## 2015-12-21 DIAGNOSIS — Z1159 Encounter for screening for other viral diseases: Secondary | ICD-10-CM

## 2015-12-21 DIAGNOSIS — G894 Chronic pain syndrome: Secondary | ICD-10-CM | POA: Diagnosis not present

## 2015-12-21 DIAGNOSIS — L210 Seborrhea capitis: Secondary | ICD-10-CM | POA: Insufficient documentation

## 2015-12-21 MED ORDER — METOPROLOL TARTRATE 100 MG PO TABS: ORAL_TABLET | ORAL | Status: DC

## 2015-12-21 MED ORDER — LOVASTATIN 40 MG PO TABS: 40.0000 mg | ORAL_TABLET | Freq: Every day | ORAL | Status: AC

## 2015-12-21 MED ORDER — CYCLOBENZAPRINE HCL 5 MG PO TABS: 5.0000 mg | ORAL_TABLET | Freq: Four times a day (QID) | ORAL | Status: DC | PRN

## 2015-12-21 MED ORDER — HYDROCHLOROTHIAZIDE 25 MG PO TABS: 25.0000 mg | ORAL_TABLET | Freq: Every day | ORAL | Status: AC

## 2015-12-21 MED ORDER — CLOBETASOL PROPIONATE 0.05 % EX SHAM: 1.0000 "application " | MEDICATED_SHAMPOO | Freq: Every day | CUTANEOUS | Status: AC

## 2015-12-21 MED ORDER — FLUOCINONIDE 0.05 % EX SOLN: 1.0000 "application " | Freq: Two times a day (BID) | CUTANEOUS | Status: DC

## 2015-12-21 MED ORDER — LISINOPRIL 10 MG PO TABS: 10.0000 mg | ORAL_TABLET | Freq: Every day | ORAL | Status: AC

## 2015-12-21 MED ORDER — KETOCONAZOLE 2 % EX SHAM: 1.0000 "application " | MEDICATED_SHAMPOO | CUTANEOUS | Status: AC

## 2015-12-21 NOTE — Patient Instructions (Signed)
We recommend that you schedule a mammogram for breast cancer screening. Typically, you do not need a referral to do this. Please contact a local imaging center to schedule your mammogram.  [facility] - [phone]  *ask for the Radiology Department [facility]) - [phone] or [phone]  [facility] - [phone] [facility] - [phone] [facility] - [phone]  *ask for the Radiology Department [facility] - [phone]  *ask for the Radiology Department [facility] - [phone]  *ask for the Mammography Department [facility] - [phone] 

## 2015-12-21 NOTE — Progress Notes (Signed)
Subjective:    Patient ID: Victoria [name], female    DOB: 06/17/1956, 60 y.o.   MRN: [id number]  12/21/2015  Medication Refill and pt would like to discuss right hip pain   HPI This 60 y.o. female presents for six month follow-up:   1. HTN: Patient reports good compliance with medication, good tolerance to medication, and good symptom control.  120s/75.    2.  Hyperlipidemia: Patient reports good compliance with medication, good tolerance to medication, and good symptom control.   Increased Lovastatin to 31m daily.  LDL 1112; HDL 43.  Triglycerides 334.    3. Glucose Intolerance:  Gained weight.  HgbA1c 6.4. S/p eye exam since last visit.  4.  Chronic pain syndrome/R hip pain/injury: Flexeril has really helped with numbness; sleeping much better with Flexeril.  With seated position and goes to stand up, felt like something shifted over in R hip; then felt something move out of the way; having intermittent groin pain; onset two months.  Worried about the need for surgery. Going from sitting to standing causes pain; in the bed, abducting and adducting causes pain.  No xray recently; this R hip and fell at motel a few years ago.    5.  Ear dermatitis B: previously took mametazone for it.  Now going back to hairline; wearing dandruff shampoo to help but nothing help.  Bought some T gel recently.  Went on consumer reports; recommended head and shoulders clean; loves smell.  T gel smells horrible.  Skin and hair are changing.  No previous dandruff.  When let hair grow rash on scalp improved.  No coloring.  Shorter hair seems to worsen symptoms.  Homemade mixture of vinegar.   Mammogram scheduling this week.   Colonoscopy scheduled 01/14/16. Eye exam 07/2015; GCarolynn Sayers  Review of Systems  Constitutional: Negative for fever, chills, diaphoresis and fatigue.  Eyes: Negative for visual disturbance.  Respiratory: Negative for cough and shortness of breath.   Cardiovascular: Negative for chest pain,  palpitations and leg swelling.  Gastrointestinal: Negative for nausea, vomiting, abdominal pain, diarrhea and constipation.  Endocrine: Negative for cold intolerance, heat intolerance, polydipsia, polyphagia and polyuria.  Musculoskeletal: Positive for myalgias, back pain, arthralgias and gait problem. Negative for joint swelling.  Skin: Positive for rash.  Neurological: Negative for dizziness, tremors, seizures, syncope, facial asymmetry, speech difficulty, weakness, light-headedness, numbness and headaches.    Past Medical History  Diagnosis Date  . CARDIAC ARRHYTHMIA 01/11/2010  . DEPRESSION 01/11/2010  . DIABETES MELLITUS, TYPE II 01/11/2010  . DYSPNEA 12/30/2010  . GERD 01/11/2010  . GOITER, MULTINODULAR 01/22/2010  . Headache(784.0) 11/30/2010  . HYPERTENSION 01/11/2010  . HYPERTHYROIDISM 01/22/2010  . LEUKOCYTOSIS UNSPECIFIED 01/11/2010  . Morbid obesity (HAlfalfa 01/11/2010  . SLEEP APNEA 01/11/2010  . TOBACCO ABUSE 01/11/2010  . UTERINE POLYP 01/11/2010  . Allergy   . Arthritis   . Chronic pain syndrome    Past Surgical History  Procedure Laterality Date  . Uterine polyp  2005    Removed  . Cesarean section  1987 and 1990    x 2  . Cholecystectomy  1992   Allergies  Allergen Reactions  . Droperidol     Social History   Social History  . Marital Status: Divorced    Spouse Name: N/A  . Number of Children: N/A  . Years of Education: N/A   Occupational History  . Not on file.   Social History Main Topics  . Smoking status: Former Smoker -- 0.50 packs/day  for 20 years    Types: Cigarettes  . Smokeless tobacco: Never Used     Comment: Divorced-moved to [place] to live with her mother  . Alcohol Use: No  . Drug Use: No  . Sexual Activity: Not Currently   Other Topics Concern  . Not on file   Social History Narrative   Marital status: Single; not dating; not interested       Children;  2 children in [place]; no grandchildren.  Mom in [place] so moved to [facility].      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     Seatbelt: 100%      ADLs: no assistant devices; independent with ADLs.        Sexual activity: not sexually active; last sexual activity 1996.     Family History  Problem Relation Age of Onset  . Emphysema Mother   . COPD Mother   . Diabetes Mother   . Cancer Mother 60    lung cancer  . Alcohol abuse Other     parent not sure which 1  . Arthritis Other     grandparent not sure which 1  . Colon cancer Other     Parent not sure which 1  . Heart disease Other   . Heart disease Father 64    CABG       Objective:    BP 118/80 mmHg  Pulse 69  Temp(Src) 98.3 F (36.8 C) (Oral)  Resp 16  Ht 5' 3.75" (1.619 m)  Wt 252 lb (114.306 kg)  BMI 43.61 kg/m2  SpO2 95% Physical Exam  Constitutional: She is oriented to person, place, and time. She appears well-developed and well-nourished. No distress.  obese  HENT:  Head: Normocephalic and atraumatic.  Right Ear: External ear normal.  Left Ear: External ear normal.  Nose: Nose normal.  Mouth/Throat: Oropharynx is clear and moist.  Eyes: Conjunctivae and EOM are normal. Pupils are equal, round, and reactive to light.  Neck: Normal range of motion. Neck supple. Carotid bruit is not present. No thyromegaly present.  Cardiovascular: Normal rate, regular rhythm, normal heart sounds and intact distal pulses.  Exam reveals no gallop and no friction rub.   No murmur heard. Pulmonary/Chest: Effort normal and breath sounds normal. She has no wheezes. She has no rales.  Abdominal: Soft. Bowel sounds are normal. She exhibits no distension and no mass. There is no tenderness. There is no rebound and no guarding.  Musculoskeletal:       Right hip: Normal. She exhibits normal range  of motion, normal strength, no tenderness, no bony tenderness and no swelling.  Lymphadenopathy:    She has no cervical adenopathy.  Neurological: She is alert and oriented to person, place, and time. No cranial nerve deficit.  Skin: Skin is warm and dry. No rash noted. She is not diaphoretic. No erythema. No pallor.  Scaling patchy rash posterior occipital scalp region; scattered flakes within hair throughout; scaling in B ears at entrance of canals B.  Psychiatric: She has a normal mood and affect. Her behavior is normal.   Results for orders placed or performed in visit on 06/29/15  CBC with Differential/Platelet  Result Value Ref Range   WBC 8.3 4.0 - 10.5 K/uL   RBC 4.75 3.87 - 5.11 MIL/uL   Hemoglobin 13.4 12.0 - 15.0 g/dL   HCT 39.4 36.0 - 46.0 %   MCV 82.9 78.0 - 100.0 fL   MCH 28.2 26.0 - 34.0 pg   MCHC 34.0 30.0 - 36.0 g/dL   RDW 14.5 11.5 - 15.5 %   Platelets 263 150 - 400 K/uL   MPV 9.0 8.6 - 12.4 fL   Neutrophils Relative % 55 43 - 77 %   Neutro Abs 4.6 1.7 - 7.7 K/uL   Lymphocytes Relative 35 12 - 46 %   Lymphs Abs 2.9 0.7 - 4.0 K/uL   Monocytes Relative 6 3 - 12 %   Monocytes Absolute 0.5 0.1 - 1.0 K/uL   Eosinophils Relative 3 0 - 5 %   Eosinophils Absolute 0.2 0.0 - 0.7 K/uL   Basophils Relative 1 0 - 1 %   Basophils Absolute 0.1 0.0 - 0.1 K/uL   Smear Review Criteria for review not met   Comprehensive metabolic panel  Result Value Ref Range   Sodium 140 135 - 146 mmol/L   Potassium 4.7 3.5 - 5.3 mmol/L   Chloride 100 98 - 110 mmol/L   CO2 28 20 - 31 mmol/L   Glucose, Bld 93 65 - 99 mg/dL   BUN 18 7 - 25 mg/dL   Creat 0.66 0.50 - 1.05 mg/dL   Total Bilirubin 0.4 0.2 - 1.2 mg/dL   Alkaline Phosphatase 57 33 - 130 U/L   AST 21 10 - 35 U/L   ALT 14 6 - 29 U/L   Total Protein 6.6 6.1 - 8.1 g/dL   Albumin 3.9 3.6 - 5.1 g/dL   Calcium 9.4 8.6 - 10.4 mg/dL  Hemoglobin A1c  Result Value Ref Range   Hgb A1c MFr Bld 6.4 (H) <5.7 %   Mean Plasma Glucose 137  (H) <117 mg/dL  Lipid panel  Result Value Ref Range   Cholesterol 222 (H) 125 - 200 mg/dL   Triglycerides 334 (H) <150 mg/dL   HDL 43 (L) >=46 mg/dL   Total CHOL/HDL Ratio 5.2 (H) <=5.0 Ratio   VLDL 67 (H) <30 mg/dL   LDL Cholesterol 112 <130 mg/dL  TSH  Result Value Ref Range   TSH 1.588 0.350 - 4.500 uIU/mL  Microalbumin, urine  Result Value Ref Range   Microalb, Ur 4.2 (H) <2.0 mg/dL  HIV antibody  Result Value Ref Range   HIV 1&2 Ab, 4th Generation NONREACTIVE NONREACTIVE  RPR  Result Value Ref Range   RPR Ser Ql NON REAC NON REAC  POCT urinalysis dipstick  Result Value Ref Range   Color, UA dark yellow    Clarity, UA cloudy    Glucose, UA neg    Bilirubin, UA small    Ketones, UA trace    Spec Grav, UA 1.025    Blood, UA neg    pH, UA 5.5    Protein, UA 30    Urobilinogen, UA 0.2    Nitrite, UA neg    Leukocytes, UA Negative Negative  Pap IG, CT/NG NAA, and HPV (high risk)  Result Value Ref Range   HPV DNA High Risk Not Detected    Specimen adequacy: SEE NOTE    FINAL DIAGNOSIS: SEE NOTE    Cytotechnologist: SEE NOTE    Chlamydia Probe Amp NEGATIVE    GC Probe Amp NEGATIVE  Assessment & Plan:   1. Hyperlipidemia   2. Incisional hernia, without obstruction or gangrene   3. Essential hypertension   4. Glucose intolerance (impaired glucose tolerance)   5. Need for hepatitis C screening test   6. Psoriasis of scalp   7. Seborrhea capitis     1. Hyperlipidemia: uncontrolled at last visit; increased Lovastatin to 49m daily; obtain labs to see improvement in LDL. 2.  Incisional hernia: stable; s/p general surgery consultation; no surgical intervention recommended. 3.  HTN: controlled; obtain labs; refills provided. 4.  Glucose intolerance: worsening; recommend weight loss, exercise, low-cholesterol food choices. 5.  Psoriasis scalp: New. Rx for topical steroid solutions and shampoos provided. 6.  Seborrhea capitis: New. rx for Ketoconazole shampoo  provided. 7.  Screening hepatitis C: obtain. 8. R hip strain/pain: New. Recommend rest, icing, stretching.  Pt declined xray today. 9. Chronic pain syndrome: refill of Flexeril provided.   Orders Placed This Encounter  Procedures  . CBC with Differential/Platelet  . Comprehensive metabolic panel    Order Specific Question:  Has the patient fasted?    Answer:  Yes  . Hemoglobin A1c  . Lipid panel    Order Specific Question:  Has the patient fasted?    Answer:  Yes  . Hepatitis C antibody   Meds ordered this encounter  Medications  . cyclobenzaprine (FLEXERIL) 5 MG tablet    Sig: Take 1 tablet (5 mg total) by mouth 4 (four) times daily as needed for muscle spasms.    Dispense:  270 tablet    Refill:  1  . lisinopril (PRINIVIL,ZESTRIL) 10 MG tablet    Sig: Take 1 tablet (10 mg total) by mouth daily.    Dispense:  90 tablet    Refill:  3  . metoprolol (LOPRESSOR) 100 MG tablet    Sig: TAKE 1 TABLET BY MOUTH 2 TIMES DAILY.    Dispense:  180 tablet    Refill:  1  . lovastatin (MEVACOR) 40 MG tablet    Sig: Take 1 tablet (40 mg total) by mouth daily.    Dispense:  90 tablet    Refill:  3  . hydrochlorothiazide (HYDRODIURIL) 25 MG tablet    Sig: Take 1 tablet (25 mg total) by mouth daily.    Dispense:  90 tablet    Refill:  3  . fluocinonide (LIDEX) 0.05 % external solution    Sig: Apply 1 application topically 2 (two) times daily.    Dispense:  60 mL    Refill:  1  . Clobetasol Propionate 0.05 % shampoo    Sig: Apply 1 application topically daily.    Dispense:  118 mL    Refill:  4  . ketoconazole (NIZORAL) 2 % shampoo    Sig: Apply 1 application topically 2 (two) times a week.    Dispense:  120 mL    Refill:  3    Return in about 6 months (around 06/19/2016) for complete physical examiniation.    Kynslei Art MElayne Guerin M.D. [facility]burn RoadGAtoka [place]  [phone]

## 2015-12-23 LAB — CBC WITH DIFFERENTIAL/PLATELET
Basophils Absolute: 0.1 K/uL (ref 0.0–0.1)
Basophils Relative: 1 % (ref 0–1)
Eosinophils Absolute: 0.2 K/uL (ref 0.0–0.7)
Eosinophils Relative: 2 % (ref 0–5)
HCT: 42.7 % (ref 36.0–46.0)
Hemoglobin: 13.6 g/dL (ref 12.0–15.0)
Lymphocytes Relative: 37 % (ref 12–46)
Lymphs Abs: 2.8 K/uL (ref 0.7–4.0)
MCH: 27.1 pg (ref 26.0–34.0)
MCHC: 31.9 g/dL (ref 30.0–36.0)
MCV: 85.1 fL (ref 78.0–100.0)
MPV: 8.9 fL (ref 8.6–12.4)
Monocytes Absolute: 0.4 K/uL (ref 0.1–1.0)
Monocytes Relative: 5 % (ref 3–12)
Neutro Abs: 4.1 K/uL (ref 1.7–7.7)
Neutrophils Relative %: 55 % (ref 43–77)
Platelets: 301 K/uL (ref 150–400)
RBC: 5.02 MIL/uL (ref 3.87–5.11)
RDW: 13.7 % (ref 11.5–15.5)
WBC: 7.5 K/uL (ref 4.0–10.5)

## 2015-12-23 LAB — HEMOGLOBIN A1C
Hgb A1c MFr Bld: 6.5 % — ABNORMAL HIGH
Mean Plasma Glucose: 140 mg/dL — ABNORMAL HIGH

## 2015-12-23 LAB — COMPREHENSIVE METABOLIC PANEL WITH GFR
ALT: 12 U/L (ref 6–29)
AST: 18 U/L (ref 10–35)
Albumin: 3.6 g/dL (ref 3.6–5.1)
Alkaline Phosphatase: 62 U/L (ref 33–130)
BUN: 17 mg/dL (ref 7–25)
CO2: 27 mmol/L (ref 20–31)
Calcium: 9.5 mg/dL (ref 8.6–10.4)
Chloride: 100 mmol/L (ref 98–110)
Creat: 0.61 mg/dL (ref 0.50–0.99)
Glucose, Bld: 107 mg/dL — ABNORMAL HIGH (ref 65–99)
Potassium: 4.3 mmol/L (ref 3.5–5.3)
Sodium: 136 mmol/L (ref 135–146)
Total Bilirubin: 0.3 mg/dL (ref 0.2–1.2)
Total Protein: 6.8 g/dL (ref 6.1–8.1)

## 2015-12-23 LAB — HEPATITIS C ANTIBODY: HCV Ab: NEGATIVE

## 2015-12-23 LAB — LIPID PANEL
CHOL/HDL RATIO: 4.6 ratio (ref <=5.0)
Cholesterol: 182 mg/dL (ref 125–200)
HDL: 40 mg/dL — ABNORMAL LOW (ref >=46)
LDL Cholesterol: 88 mg/dL (ref <130)
Triglycerides: 269 mg/dL — ABNORMAL HIGH (ref <150)
VLDL: 54 mg/dL — AB (ref <30)

## 2015-12-29 ENCOUNTER — Encounter: Payer: Self-pay | Admitting: Family Medicine

## 2015-12-31 ENCOUNTER — Ambulatory Visit (AMBULATORY_SURGERY_CENTER): Payer: Self-pay

## 2015-12-31 VITALS — Ht 63.0 in | Wt 252.0 lb

## 2015-12-31 DIAGNOSIS — Z1211 Encounter for screening for malignant neoplasm of colon: Secondary | ICD-10-CM

## 2015-12-31 NOTE — Progress Notes (Signed)
No egg or soy allergies Not on home 02 No previous anesthesia complications Emmi video declined. No diet or weight loss meds 

## 2016-01-14 ENCOUNTER — Encounter: Payer: Self-pay | Admitting: Internal Medicine

## 2016-01-14 ENCOUNTER — Ambulatory Visit (AMBULATORY_SURGERY_CENTER): Payer: BLUE CROSS/BLUE SHIELD | Admitting: Internal Medicine

## 2016-01-14 VITALS — BP 98/58 | HR 58 | Temp 99.5°F | Resp 11 | Ht 63.0 in | Wt 252.0 lb

## 2016-01-14 DIAGNOSIS — D122 Benign neoplasm of ascending colon: Secondary | ICD-10-CM

## 2016-01-14 DIAGNOSIS — Z1211 Encounter for screening for malignant neoplasm of colon: Secondary | ICD-10-CM | POA: Diagnosis present

## 2016-01-14 MED ORDER — SODIUM CHLORIDE 0.9 % IV SOLN: 500.0000 mL | INTRAVENOUS | Status: DC

## 2016-01-14 NOTE — Op Note (Signed)
[facility]  [address]. [place], [address]   COLONOSCOPY PROCEDURE REPORT  PATIENT: Victoria [name], Victoria [name]  MR#: [id number] BIRTHDATE: 1956/02/25 , 59  yrs. old GENDER: female ENDOSCOPIST: Eustace Quail, MD REFERRED IN:[id number] Tamala Julian, M.D. PROCEDURE DATE:  01/14/2016 PROCEDURE:   Colonoscopy, screening and Colonoscopy with snare polypectomy x 4 First Screening Colonoscopy - Avg.  risk and is 50 yrs.  old or older Yes.  Prior Negative Screening - Now for repeat screening. N/A  History of Adenoma - Now for follow-up colonoscopy & has been > or = to 3 yrs.  N/A  Polyps removed today? Yes ASA CLASS:   Class II INDICATIONS:Screening for colonic neoplasia and Colorectal Neoplasm Risk Assessment for this procedure is average risk. MEDICATIONS: Propofol 250 mg IV and Monitored anesthesia care  DESCRIPTION OF PROCEDURE:   After the risks benefits and alternatives of the procedure were thoroughly explained, informed consent was obtained.  The digital rectal exam revealed no abnormalities of the rectum.   The [id number]  endoscope was introduced through the anus and advanced to the cecum, which was identified by both the appendix and ileocecal valve. No adverse events experienced.   The quality of the prep was excellent. (MiraLax was used)  The instrument was then slowly withdrawn as the colon was fully examined. Estimated blood loss is zero unless otherwise noted in this procedure report.      COLON FINDINGS: Four polyps ranging between 3-53mm in size were found in the ascending colon.  A polypectomy was performed with a cold snare.  The resection was complete, the polyp tissue was completely retrieved and sent to histology.   The examination was otherwise normal.  Retroflexed views revealed no abnormalities. The time to cecum = 3.1 Withdrawal time = 15.9   The scope was withdrawn and the procedure completed. COMPLICATIONS: There were no immediate  complications.  ENDOSCOPIC IMPRESSION: 1.   Four polyps were found in the ascending colon; polypectomy was performed with a cold snare 2.   The examination was otherwise normal  RECOMMENDATIONS: 1. Repeat Colonoscopy in 3 years (multiple polyps).  eSigned:  Eustace Quail, MD 01/14/2016 12:36 PM   cc: The Patient and Reginia Forts, MD

## 2016-01-14 NOTE — Progress Notes (Signed)
Called to room to assist during endoscopic procedure.  Patient ID and intended procedure confirmed with present staff. Received instructions for my participation in the procedure from the performing physician.  

## 2016-01-14 NOTE — Progress Notes (Signed)
Report to PACU, RN, vss, BBS= Clear.  

## 2016-01-14 NOTE — Patient Instructions (Signed)
YOU HAD AN ENDOSCOPIC PROCEDURE TODAY AT THE [facility]:   Refer to the procedure report that was given to you for any specific questions about what was found during the examination.  If the procedure report does not answer your questions, please call your gastroenterologist to clarify.  If you requested that your care partner not be given the details of your procedure findings, then the procedure report has been included in a sealed envelope for you to review at your convenience later.  YOU SHOULD EXPECT: Some feelings of bloating in the abdomen. Passage of more gas than usual.  Walking can help get rid of the air that was put into your GI tract during the procedure and reduce the bloating. If you had a lower endoscopy (such as a colonoscopy or flexible sigmoidoscopy) you may notice spotting of blood in your stool or on the toilet paper. If you underwent a bowel prep for your procedure, you may not have a normal bowel movement for a few days.  Please Note:  You might notice some irritation and congestion in your nose or some drainage.  This is from the oxygen used during your procedure.  There is no need for concern and it should clear up in a day or so.  SYMPTOMS TO REPORT IMMEDIATELY:   Following lower endoscopy (colonoscopy or flexible sigmoidoscopy):  Excessive amounts of blood in the stool  Significant tenderness or worsening of abdominal pains  Swelling of the abdomen that is new, acute  Fever of 100F or higher    For urgent or emergent issues, a gastroenterologist can be reached at any hour by calling [phone].   DIET: Your first meal following the procedure should be a small meal and then it is ok to progress to your normal diet. Heavy or fried foods are harder to digest and may make you feel nauseous or bloated.  Likewise, meals heavy in dairy and vegetables can increase bloating.  Drink plenty of fluids but you should avoid alcoholic beverages for 24  hours.  ACTIVITY:  You should plan to take it easy for the rest of today and you should NOT DRIVE or use heavy machinery until tomorrow (because of the sedation medicines used during the test).    FOLLOW UP: Our staff will call the number listed on your records the next business day following your procedure to check on you and address any questions or concerns that you may have regarding the information given to you following your procedure. If we do not reach you, we will leave a message.  However, if you are feeling well and you are not experiencing any problems, there is no need to return our call.  We will assume that you have returned to your regular daily activities without incident.  If any biopsies were taken you will be contacted by phone or by letter within the next 1-3 weeks.  Please call us at [phone] if you have not heard about the biopsies in 3 weeks.    SIGNATURES/CONFIDENTIALITY: You and/or your care partner have signed paperwork which will be entered into your electronic medical record.  These signatures attest to the fact that that the information above on your After Visit Summary has been reviewed and is understood.  Full responsibility of the confidentiality of this discharge information lies with you and/or your care-partner.   INFORMATION ON POLYPS GIVEN TO YOU TODAY    

## 2016-01-15 ENCOUNTER — Telehealth: Payer: Self-pay | Admitting: Emergency Medicine

## 2016-01-15 NOTE — Telephone Encounter (Signed)
  Follow up Call-  Call back number 01/14/2016  Post procedure Call Back phone  # [phone]  Permission to leave phone message Yes     Patient questions:  Do you have a fever, pain , or abdominal swelling? No. Pain Score  0 *  Have you tolerated food without any problems? Yes.    Have you been able to return to your normal activities? Yes.    Do you have any questions about your discharge instructions: Diet   No. Medications  No. Follow up visit  No.  Do you have questions or concerns about your Care? No.  Actions: * If pain score is 4 or above: No action needed, pain <4.

## 2016-01-22 ENCOUNTER — Encounter: Payer: Self-pay | Admitting: Internal Medicine

## 2016-03-08 ENCOUNTER — Other Ambulatory Visit: Payer: Self-pay | Admitting: Family Medicine

## 2016-05-20 ENCOUNTER — Other Ambulatory Visit: Payer: Self-pay | Admitting: Family Medicine

## 2016-06-08 ENCOUNTER — Other Ambulatory Visit: Payer: Self-pay | Admitting: Family Medicine

## 2016-07-14 ENCOUNTER — Telehealth: Payer: Self-pay

## 2016-07-14 NOTE — Telephone Encounter (Signed)
Pt would like a refill on her metoprolol (LOPRESSOR) 100 MG tablet [id number]. She would like [place] to send it to [place], [place] [address]. She would like to discuss several issues with Dr. Tamala Julian before her appointment on 07/19/16 for a CPE. Please advise at [phone]

## 2016-07-16 MED ORDER — METOPROLOL TARTRATE 100 MG PO TABS: ORAL_TABLET | ORAL | 0 refills | Status: DC

## 2016-07-16 NOTE — Telephone Encounter (Signed)
Patient has appt for CPE on Tuesday. I have called to let them know Dr Tamala Julian is in clinic, but very busy, and I do not think she will have a chance to call before Tuesday. Patient is moving 5 hours away and is trying to find out if you can provide her a 3 month supply of her Metoprolol (one month supply was sent for her, since she needed refill and had CPE). However, patient will likely be cancelling the CPE and establishing with new provider where she is moving.   Ok to send in the 3 mo supply of Metoprolol ? She also states she needs Flexeril, when you send in for her on 05/21/16 you indicated OV needed. / Please advise.

## 2016-07-19 ENCOUNTER — Encounter: Payer: BLUE CROSS/BLUE SHIELD | Admitting: Family Medicine

## 2016-07-22 MED ORDER — CYCLOBENZAPRINE HCL 5 MG PO TABS: 5.0000 mg | ORAL_TABLET | Freq: Three times a day (TID) | ORAL | 0 refills | Status: AC | PRN

## 2016-07-22 MED ORDER — METOPROLOL TARTRATE 100 MG PO TABS: ORAL_TABLET | ORAL | 0 refills | Status: AC

## 2016-07-22 NOTE — Telephone Encounter (Signed)
I approved refills of Metoprolol and Flexeril as requested.  Please advise patient.

## 2016-08-23 ENCOUNTER — Encounter: Payer: BLUE CROSS/BLUE SHIELD | Admitting: Family Medicine

## 2016-08-31 ENCOUNTER — Other Ambulatory Visit: Payer: Self-pay | Admitting: Family Medicine

## 2016-09-20 ENCOUNTER — Encounter

## 2016-09-28 ENCOUNTER — Other Ambulatory Visit: Payer: Self-pay | Admitting: Physician Assistant

## 2016-09-28 NOTE — Telephone Encounter (Signed)
11/2015 last ov and labs 08/23/16 appt. cx by [place]?

## 2016-09-29 NOTE — Telephone Encounter (Signed)
Please call --- overdue for six month follow-up; approved one month refill of Metoprolol; please schedule OV with me.

## 2016-10-25 ENCOUNTER — Other Ambulatory Visit: Payer: Self-pay | Admitting: Family Medicine

## 2016-10-27 NOTE — Telephone Encounter (Signed)
07/2016 last refill 11/2015 last ov

## 2016-10-27 NOTE — Telephone Encounter (Signed)
Refill denied. Last office visit with me 11/2015.  Overdue for six month follow-up.  Pt call to schedule OV with me.

## 2016-11-02 ENCOUNTER — Other Ambulatory Visit: Payer: Self-pay | Admitting: Family Medicine

## 2016-11-02 ENCOUNTER — Inpatient Hospital Stay: Payer: BLUE CROSS/BLUE SHIELD | Attending: Critical Care Medicine | Primary: Internal Medicine

## 2016-11-07 NOTE — Telephone Encounter (Signed)
Last visit and lab 11/2015 needs ov

## 2016-12-06 ENCOUNTER — Other Ambulatory Visit: Payer: Self-pay | Admitting: Family Medicine

## 2016-12-26 DIAGNOSIS — G4733 Obstructive sleep apnea (adult) (pediatric): Secondary | ICD-10-CM

## 2016-12-27 ENCOUNTER — Inpatient Hospital Stay: Admit: 2016-12-27 | Payer: PRIVATE HEALTH INSURANCE | Attending: Critical Care Medicine | Primary: Internal Medicine

## 2017-07-06 ENCOUNTER — Encounter

## 2017-07-20 ENCOUNTER — Inpatient Hospital Stay: Payer: PRIVATE HEALTH INSURANCE | Attending: Internal Medicine | Primary: Internal Medicine

## 2017-07-26 ENCOUNTER — Ambulatory Visit: Payer: PRIVATE HEALTH INSURANCE | Primary: Internal Medicine

## 2017-08-02 ENCOUNTER — Inpatient Hospital Stay: Payer: PRIVATE HEALTH INSURANCE | Attending: Internal Medicine | Primary: Internal Medicine

## 2017-08-14 ENCOUNTER — Encounter

## 2017-08-14 ENCOUNTER — Inpatient Hospital Stay: Admit: 2017-08-14 | Payer: PRIVATE HEALTH INSURANCE | Attending: Internal Medicine | Primary: Internal Medicine

## 2017-08-14 DIAGNOSIS — Z1231 Encounter for screening mammogram for malignant neoplasm of breast: Secondary | ICD-10-CM

## 2017-09-21 ENCOUNTER — Other Ambulatory Visit: Payer: Self-pay | Admitting: Family Medicine

## 2018-04-17 ENCOUNTER — Encounter: Payer: Self-pay | Admitting: Family Medicine

## 2018-08-29 ENCOUNTER — Encounter

## 2018-09-11 ENCOUNTER — Inpatient Hospital Stay: Payer: PRIVATE HEALTH INSURANCE | Attending: Internal Medicine | Primary: Internal Medicine

## 2018-09-26 ENCOUNTER — Encounter: Payer: PRIVATE HEALTH INSURANCE | Primary: Internal Medicine

## 2018-10-08 NOTE — Anesthesia Pre-Procedure Evaluation (Addendum)
Relevant Problems   No relevant active problems       Anesthetic History   No history of anesthetic complications            Review of Systems / Medical History  Patient summary reviewed, nursing notes reviewed and pertinent labs reviewed    Pulmonary        Sleep apnea           Neuro/Psych   Within defined limits           Cardiovascular    Hypertension        Dysrhythmias       Exercise tolerance: >4 METS  Comments: PSVT   GI/Hepatic/Renal  Within defined limits              Endo/Other    Diabetes: type 2  Hypothyroidism  Morbid obesity and arthritis     Other Findings            Physical Exam    Airway  Mallampati: III  TM Distance: 4 - 6 cm  Neck ROM: normal range of motion, short neck   Mouth opening: Normal     Cardiovascular  Regular rate and rhythm,  S1 and S2 normal,  no murmur, click, rub, or gallop             Dental  No notable dental hx       Pulmonary  Breath sounds clear to auscultation               Abdominal  GI exam deferred       Other Findings            Anesthetic Plan    ASA: 3  Anesthesia type: total IV anesthesia and general                High flow nasal oxygen for case

## 2018-10-08 NOTE — Anesthesia Pre-Procedure Evaluation (Signed)
Relevant Problems   No relevant active problems       Anesthetic History   No history of anesthetic complications            Review of Systems / Medical History  Patient summary reviewed, nursing notes reviewed and pertinent labs reviewed    Pulmonary        Sleep apnea           Neuro/Psych   Within defined limits           Cardiovascular    Hypertension        Dysrhythmias       Exercise tolerance: >4 METS  Comments: PSVT   GI/Hepatic/Renal  Within defined limits              Endo/Other    Diabetes: type 2  Hypothyroidism  Morbid obesity and arthritis     Other Findings            Physical Exam    Airway  Mallampati: III  TM Distance: 4 - 6 cm  Neck ROM: normal range of motion, short neck   Mouth opening: Normal     Cardiovascular  Regular rate and rhythm,  S1 and S2 normal,  no murmur, click, rub, or gallop             Dental  No notable dental hx       Pulmonary  Breath sounds clear to auscultation               Abdominal  GI exam deferred       Other Findings            Anesthetic Plan    ASA: 3  Anesthesia type: total IV anesthesia and general                High flow nasal oxygen for case

## 2018-10-09 ENCOUNTER — Inpatient Hospital Stay: Payer: BLUE CROSS/BLUE SHIELD

## 2018-10-09 ENCOUNTER — Ambulatory Visit: Payer: PRIVATE HEALTH INSURANCE | Primary: Internal Medicine

## 2018-10-09 LAB — POCT GLUCOSE: POC Glucose: 157 mg/dL — ABNORMAL HIGH (ref 65–105)

## 2018-10-09 LAB — GLUCOSE, POC: Glucose (POC): 157 mg/dL — ABNORMAL HIGH (ref 65–105)

## 2018-10-09 MED ORDER — KETAMINE 50 MG/ML IJ SOLN
50 mg/mL | INTRAMUSCULAR | Status: DC | PRN
Start: 2018-10-09 — End: 2018-10-09
  Administered 2018-10-09: 15:00:00 via INTRAVENOUS

## 2018-10-09 MED ORDER — KETAMINE 50 MG/ML (1 ML) INTRAVENOUS SYRINGE
50 mg/mL (1 mL) | INTRAVENOUS | Status: AC
Start: 2018-10-09 — End: ?

## 2018-10-09 MED ORDER — SODIUM CHLORIDE 0.9 % IJ SYRG
Freq: Three times a day (TID) | INTRAMUSCULAR | Status: DC
Start: 2018-10-09 — End: 2018-10-09

## 2018-10-09 MED ORDER — LIDOCAINE (PF) 20 MG/ML (2 %) IJ SOLN
20 mg/mL (2 %) | INTRAMUSCULAR | Status: DC | PRN
Start: 2018-10-09 — End: 2018-10-09
  Administered 2018-10-09: 15:00:00 via INTRAVENOUS

## 2018-10-09 MED ORDER — SODIUM CHLORIDE 0.9 % IV
INTRAVENOUS | Status: DC
Start: 2018-10-09 — End: 2018-10-09
  Administered 2018-10-09 (×2): via INTRAVENOUS

## 2018-10-09 MED ORDER — PROPOFOL 10 MG/ML IV EMUL
10 mg/mL | INTRAVENOUS | Status: DC | PRN
Start: 2018-10-09 — End: 2018-10-09
  Administered 2018-10-09: 15:00:00 via INTRAVENOUS

## 2018-10-09 MED ORDER — SODIUM CHLORIDE 0.9 % IJ SYRG
INTRAMUSCULAR | Status: DC | PRN
Start: 2018-10-09 — End: 2018-10-09

## 2018-10-09 MED ORDER — DEXMEDETOMIDINE 100 MCG/ML IV SOLN
100 mcg/mL | INTRAVENOUS | Status: DC | PRN
Start: 2018-10-09 — End: 2018-10-09
  Administered 2018-10-09: 15:00:00 via INTRAVENOUS

## 2018-10-09 MED ORDER — PROPOFOL 10 MG/ML IV EMUL
10 mg/mL | INTRAVENOUS | Status: DC | PRN
Start: 2018-10-09 — End: 2018-10-09
  Administered 2018-10-09 (×3): via INTRAVENOUS

## 2018-10-09 MED FILL — KETAMINE 50 MG/ML (1 ML) INTRAVENOUS SYRINGE: 50 mg/mL (1 mL) | INTRAVENOUS | Qty: 1

## 2018-10-09 MED FILL — SODIUM CHLORIDE 0.9 % IV: INTRAVENOUS | Qty: 1000

## 2018-10-09 NOTE — H&P (Signed)
Date of Surgery Update:  Jocelyn Allen was seen and examined.  History and physical has been reviewed. The patient has been examined. There have been no significant clinical changes since the completion of the originally dated History and Physical.    Signed By: Hisashi Amadon M Foye Haggart, MD     October 09, 2018 8:01 AM

## 2018-10-09 NOTE — Anesthesia Post-Procedure Evaluation (Signed)
Procedure(s):  ESOPHAGOGASTRODUODENOSCOPY w bx and 54 savory dilation  COLONOSCOPY  w cold snare polypectomy.    total IV anesthesia, general    Anesthesia Post Evaluation      Multimodal analgesia: multimodal analgesia not used between 6 hours prior to anesthesia start to PACU discharge  Patient participation: complete - patient participated  Level of consciousness: awake and alert  Pain management: satisfactory to patient  Airway patency: patent  Anesthetic complications: no  Cardiovascular status: acceptable  Respiratory status: acceptable  Hydration status: acceptable  Post anesthesia nausea and vomiting:  none      No vitals data found for the desired time range.

## 2018-10-09 NOTE — H&P (Signed)
Date of Surgery Update:  Elisabeth CaraCheryl H Mcgaha was seen and examined.  History and physical has been reviewed. The patient has been examined. There have been no significant clinical changes since the completion of the originally dated History and Physical.    Signed By: Juliane LackBrian M Zamere Pasternak, MD     October 09, 2018 8:01 AM

## 2018-10-09 NOTE — Anesthesia Post-Procedure Evaluation (Signed)
Procedure(s):  ESOPHAGOGASTRODUODENOSCOPY w bx and 54 savory dilation  COLONOSCOPY  w cold snare polypectomy.    total IV anesthesia, general    Anesthesia Post Evaluation      Multimodal analgesia: multimodal analgesia not used between 6 hours prior to anesthesia start to PACU discharge  Patient participation: complete - patient participated  Level of consciousness: awake and alert  Pain management: satisfactory to patient  Airway patency: patent  Anesthetic complications: no  Cardiovascular status: acceptable  Respiratory status: acceptable  Hydration status: acceptable  Post anesthesia nausea and vomiting:  none      No vitals data found for the desired time range.

## 2018-10-11 ENCOUNTER — Ambulatory Visit: Payer: PRIVATE HEALTH INSURANCE | Primary: Internal Medicine

## 2018-10-31 ENCOUNTER — Inpatient Hospital Stay: Admit: 2018-10-31 | Payer: BLUE CROSS/BLUE SHIELD | Attending: Internal Medicine | Primary: Internal Medicine

## 2018-10-31 DIAGNOSIS — Z1231 Encounter for screening mammogram for malignant neoplasm of breast: Secondary | ICD-10-CM

## 2018-11-26 ENCOUNTER — Encounter

## 2018-12-10 ENCOUNTER — Inpatient Hospital Stay: Admit: 2018-12-10 | Payer: PRIVATE HEALTH INSURANCE | Attending: Internal Medicine | Primary: Internal Medicine

## 2018-12-10 DIAGNOSIS — R1110 Vomiting, unspecified: Secondary | ICD-10-CM

## 2019-01-19 ENCOUNTER — Encounter: Payer: Self-pay | Admitting: Internal Medicine
